# Patient Record
Sex: Female | Born: 1960 | Race: White | Hispanic: No | Marital: Married | Smoking: Never smoker
Health system: Southern US, Community
[De-identification: ages and names within clinical notes are randomized; demographics above are authoritative.]

## PROBLEM LIST (undated history)

## (undated) DIAGNOSIS — E079 Disorder of thyroid, unspecified: Secondary | ICD-10-CM

## (undated) DIAGNOSIS — E785 Hyperlipidemia, unspecified: Secondary | ICD-10-CM

## (undated) DIAGNOSIS — F32A Depression, unspecified: Secondary | ICD-10-CM

## (undated) DIAGNOSIS — I10 Essential (primary) hypertension: Secondary | ICD-10-CM

## (undated) DIAGNOSIS — M1990 Unspecified osteoarthritis, unspecified site: Secondary | ICD-10-CM

## (undated) HISTORY — DX: Unspecified osteoarthritis, unspecified site: M19.90

## (undated) HISTORY — DX: Hyperlipidemia, unspecified: E78.5

## (undated) HISTORY — DX: Essential (primary) hypertension: I10

## (undated) HISTORY — PX: BREAST CYST ASPIRATION: SHX578

## (undated) HISTORY — PX: BACK SURGERY: SHX140

## (undated) HISTORY — PX: ROTATOR CUFF REPAIR: SHX139

## (undated) HISTORY — DX: Disorder of thyroid, unspecified: E07.9

## (undated) HISTORY — DX: Depression, unspecified: F32.A

## (undated) HISTORY — PX: CHOLECYSTECTOMY: SHX55

---

## 1999-02-22 ENCOUNTER — Emergency Department (HOSPITAL_COMMUNITY): Admission: EM | Admit: 1999-02-22 | Discharge: 1999-02-22 | Payer: Self-pay | Admitting: Emergency Medicine

## 1999-03-24 ENCOUNTER — Encounter: Payer: Self-pay | Admitting: Neurosurgery

## 1999-03-24 ENCOUNTER — Ambulatory Visit (HOSPITAL_COMMUNITY): Admission: RE | Admit: 1999-03-24 | Discharge: 1999-03-24 | Payer: Self-pay | Admitting: Neurosurgery

## 1999-03-29 ENCOUNTER — Encounter: Payer: Self-pay | Admitting: Neurosurgery

## 1999-04-02 ENCOUNTER — Encounter: Payer: Self-pay | Admitting: Neurosurgery

## 1999-04-02 ENCOUNTER — Inpatient Hospital Stay (HOSPITAL_COMMUNITY): Admission: RE | Admit: 1999-04-02 | Discharge: 1999-04-03 | Payer: Self-pay | Admitting: Neurosurgery

## 1999-11-07 ENCOUNTER — Other Ambulatory Visit: Admission: RE | Admit: 1999-11-07 | Discharge: 1999-11-07 | Payer: Self-pay | Admitting: Obstetrics and Gynecology

## 2001-08-20 ENCOUNTER — Other Ambulatory Visit: Admission: RE | Admit: 2001-08-20 | Discharge: 2001-08-20 | Payer: Self-pay | Admitting: Obstetrics and Gynecology

## 2002-09-07 ENCOUNTER — Other Ambulatory Visit: Admission: RE | Admit: 2002-09-07 | Discharge: 2002-09-07 | Payer: Self-pay | Admitting: Obstetrics and Gynecology

## 2003-10-04 ENCOUNTER — Other Ambulatory Visit: Admission: RE | Admit: 2003-10-04 | Discharge: 2003-10-04 | Payer: Self-pay | Admitting: Obstetrics and Gynecology

## 2004-10-23 ENCOUNTER — Other Ambulatory Visit: Admission: RE | Admit: 2004-10-23 | Discharge: 2004-10-23 | Payer: Self-pay | Admitting: Obstetrics and Gynecology

## 2006-01-09 ENCOUNTER — Other Ambulatory Visit: Admission: RE | Admit: 2006-01-09 | Discharge: 2006-01-09 | Payer: Self-pay | Admitting: Obstetrics and Gynecology

## 2006-04-06 ENCOUNTER — Emergency Department (HOSPITAL_COMMUNITY): Admission: EM | Admit: 2006-04-06 | Discharge: 2006-04-06 | Payer: Self-pay | Admitting: Emergency Medicine

## 2006-04-10 ENCOUNTER — Ambulatory Visit (HOSPITAL_COMMUNITY): Admission: RE | Admit: 2006-04-10 | Discharge: 2006-04-10 | Payer: Self-pay | Admitting: Neurosurgery

## 2006-12-31 ENCOUNTER — Encounter: Admission: RE | Admit: 2006-12-31 | Discharge: 2006-12-31 | Payer: Self-pay | Admitting: Neurosurgery

## 2007-02-12 ENCOUNTER — Inpatient Hospital Stay (HOSPITAL_COMMUNITY): Admission: RE | Admit: 2007-02-12 | Discharge: 2007-02-15 | Payer: Self-pay | Admitting: Neurosurgery

## 2011-07-29 DIAGNOSIS — N841 Polyp of cervix uteri: Secondary | ICD-10-CM | POA: Insufficient documentation

## 2015-10-30 DIAGNOSIS — N63 Unspecified lump in unspecified breast: Secondary | ICD-10-CM | POA: Insufficient documentation

## 2015-10-31 ENCOUNTER — Other Ambulatory Visit: Payer: Self-pay | Admitting: Obstetrics and Gynecology

## 2015-10-31 DIAGNOSIS — R928 Other abnormal and inconclusive findings on diagnostic imaging of breast: Secondary | ICD-10-CM

## 2015-11-07 ENCOUNTER — Other Ambulatory Visit: Payer: Self-pay | Admitting: Obstetrics and Gynecology

## 2015-11-07 ENCOUNTER — Ambulatory Visit
Admission: RE | Admit: 2015-11-07 | Discharge: 2015-11-07 | Disposition: A | Discharge status: Home / Self Care | Payer: BLUE CROSS/BLUE SHIELD | Source: Ambulatory Visit | Attending: Obstetrics and Gynecology | Admitting: Obstetrics and Gynecology

## 2015-11-07 DIAGNOSIS — R928 Other abnormal and inconclusive findings on diagnostic imaging of breast: Secondary | ICD-10-CM

## 2015-11-07 DIAGNOSIS — N6001 Solitary cyst of right breast: Secondary | ICD-10-CM

## 2019-01-10 DIAGNOSIS — Z1231 Encounter for screening mammogram for malignant neoplasm of breast: Secondary | ICD-10-CM | POA: Diagnosis not present

## 2019-01-14 DIAGNOSIS — Z01818 Encounter for other preprocedural examination: Secondary | ICD-10-CM | POA: Diagnosis not present

## 2019-01-14 DIAGNOSIS — E039 Hypothyroidism, unspecified: Secondary | ICD-10-CM | POA: Diagnosis not present

## 2019-01-14 DIAGNOSIS — M25512 Pain in left shoulder: Secondary | ICD-10-CM | POA: Diagnosis not present

## 2019-01-27 DIAGNOSIS — M94212 Chondromalacia, left shoulder: Secondary | ICD-10-CM | POA: Diagnosis not present

## 2019-01-27 DIAGNOSIS — G8918 Other acute postprocedural pain: Secondary | ICD-10-CM | POA: Diagnosis not present

## 2019-01-27 DIAGNOSIS — M65812 Other synovitis and tenosynovitis, left shoulder: Secondary | ICD-10-CM | POA: Diagnosis not present

## 2019-01-27 DIAGNOSIS — S46212A Strain of muscle, fascia and tendon of other parts of biceps, left arm, initial encounter: Secondary | ICD-10-CM | POA: Diagnosis not present

## 2019-01-27 DIAGNOSIS — M75112 Incomplete rotator cuff tear or rupture of left shoulder, not specified as traumatic: Secondary | ICD-10-CM | POA: Diagnosis not present

## 2019-01-27 DIAGNOSIS — I1310 Hypertensive heart and chronic kidney disease without heart failure, with stage 1 through stage 4 chronic kidney disease, or unspecified chronic kidney disease: Secondary | ICD-10-CM | POA: Diagnosis not present

## 2019-01-27 DIAGNOSIS — M19012 Primary osteoarthritis, left shoulder: Secondary | ICD-10-CM | POA: Diagnosis not present

## 2019-01-27 DIAGNOSIS — M75122 Complete rotator cuff tear or rupture of left shoulder, not specified as traumatic: Secondary | ICD-10-CM | POA: Diagnosis not present

## 2019-01-27 DIAGNOSIS — M7542 Impingement syndrome of left shoulder: Secondary | ICD-10-CM | POA: Diagnosis not present

## 2019-01-27 DIAGNOSIS — E039 Hypothyroidism, unspecified: Secondary | ICD-10-CM | POA: Diagnosis not present

## 2019-01-27 DIAGNOSIS — M778 Other enthesopathies, not elsewhere classified: Secondary | ICD-10-CM | POA: Diagnosis not present

## 2019-01-27 DIAGNOSIS — E1122 Type 2 diabetes mellitus with diabetic chronic kidney disease: Secondary | ICD-10-CM | POA: Diagnosis not present

## 2019-01-27 DIAGNOSIS — E1169 Type 2 diabetes mellitus with other specified complication: Secondary | ICD-10-CM | POA: Diagnosis not present

## 2019-01-27 DIAGNOSIS — N182 Chronic kidney disease, stage 2 (mild): Secondary | ICD-10-CM | POA: Diagnosis not present

## 2019-01-27 DIAGNOSIS — M7552 Bursitis of left shoulder: Secondary | ICD-10-CM | POA: Diagnosis not present

## 2019-01-27 DIAGNOSIS — E785 Hyperlipidemia, unspecified: Secondary | ICD-10-CM | POA: Diagnosis not present

## 2019-01-27 DIAGNOSIS — E559 Vitamin D deficiency, unspecified: Secondary | ICD-10-CM | POA: Diagnosis not present

## 2019-01-31 DIAGNOSIS — M6281 Muscle weakness (generalized): Secondary | ICD-10-CM | POA: Diagnosis not present

## 2019-01-31 DIAGNOSIS — M75122 Complete rotator cuff tear or rupture of left shoulder, not specified as traumatic: Secondary | ICD-10-CM | POA: Diagnosis not present

## 2019-01-31 DIAGNOSIS — Z4789 Encounter for other orthopedic aftercare: Secondary | ICD-10-CM | POA: Diagnosis not present

## 2019-02-04 DIAGNOSIS — M6281 Muscle weakness (generalized): Secondary | ICD-10-CM | POA: Diagnosis not present

## 2019-02-04 DIAGNOSIS — M75122 Complete rotator cuff tear or rupture of left shoulder, not specified as traumatic: Secondary | ICD-10-CM | POA: Diagnosis not present

## 2019-02-04 DIAGNOSIS — Z4789 Encounter for other orthopedic aftercare: Secondary | ICD-10-CM | POA: Diagnosis not present

## 2019-02-07 DIAGNOSIS — M6281 Muscle weakness (generalized): Secondary | ICD-10-CM | POA: Diagnosis not present

## 2019-02-07 DIAGNOSIS — M75122 Complete rotator cuff tear or rupture of left shoulder, not specified as traumatic: Secondary | ICD-10-CM | POA: Diagnosis not present

## 2019-02-07 DIAGNOSIS — Z4789 Encounter for other orthopedic aftercare: Secondary | ICD-10-CM | POA: Diagnosis not present

## 2019-02-10 DIAGNOSIS — M75122 Complete rotator cuff tear or rupture of left shoulder, not specified as traumatic: Secondary | ICD-10-CM | POA: Diagnosis not present

## 2019-02-10 DIAGNOSIS — Z4789 Encounter for other orthopedic aftercare: Secondary | ICD-10-CM | POA: Diagnosis not present

## 2019-02-10 DIAGNOSIS — M6281 Muscle weakness (generalized): Secondary | ICD-10-CM | POA: Diagnosis not present

## 2019-02-17 DIAGNOSIS — Z4789 Encounter for other orthopedic aftercare: Secondary | ICD-10-CM | POA: Diagnosis not present

## 2019-02-17 DIAGNOSIS — M75122 Complete rotator cuff tear or rupture of left shoulder, not specified as traumatic: Secondary | ICD-10-CM | POA: Diagnosis not present

## 2019-02-17 DIAGNOSIS — M6281 Muscle weakness (generalized): Secondary | ICD-10-CM | POA: Diagnosis not present

## 2019-02-24 DIAGNOSIS — M6281 Muscle weakness (generalized): Secondary | ICD-10-CM | POA: Diagnosis not present

## 2019-02-24 DIAGNOSIS — M75122 Complete rotator cuff tear or rupture of left shoulder, not specified as traumatic: Secondary | ICD-10-CM | POA: Diagnosis not present

## 2019-02-24 DIAGNOSIS — Z4789 Encounter for other orthopedic aftercare: Secondary | ICD-10-CM | POA: Diagnosis not present

## 2019-03-03 DIAGNOSIS — M6281 Muscle weakness (generalized): Secondary | ICD-10-CM | POA: Diagnosis not present

## 2019-03-03 DIAGNOSIS — Z4789 Encounter for other orthopedic aftercare: Secondary | ICD-10-CM | POA: Diagnosis not present

## 2019-03-03 DIAGNOSIS — M75122 Complete rotator cuff tear or rupture of left shoulder, not specified as traumatic: Secondary | ICD-10-CM | POA: Diagnosis not present

## 2019-03-08 DIAGNOSIS — M6281 Muscle weakness (generalized): Secondary | ICD-10-CM | POA: Diagnosis not present

## 2019-03-08 DIAGNOSIS — Z4789 Encounter for other orthopedic aftercare: Secondary | ICD-10-CM | POA: Diagnosis not present

## 2019-03-08 DIAGNOSIS — M75122 Complete rotator cuff tear or rupture of left shoulder, not specified as traumatic: Secondary | ICD-10-CM | POA: Diagnosis not present

## 2019-03-15 DIAGNOSIS — Z4789 Encounter for other orthopedic aftercare: Secondary | ICD-10-CM | POA: Diagnosis not present

## 2019-03-15 DIAGNOSIS — M75122 Complete rotator cuff tear or rupture of left shoulder, not specified as traumatic: Secondary | ICD-10-CM | POA: Diagnosis not present

## 2019-03-15 DIAGNOSIS — M6281 Muscle weakness (generalized): Secondary | ICD-10-CM | POA: Diagnosis not present

## 2019-03-17 DIAGNOSIS — Z4789 Encounter for other orthopedic aftercare: Secondary | ICD-10-CM | POA: Diagnosis not present

## 2019-03-17 DIAGNOSIS — M6281 Muscle weakness (generalized): Secondary | ICD-10-CM | POA: Diagnosis not present

## 2019-03-17 DIAGNOSIS — M75122 Complete rotator cuff tear or rupture of left shoulder, not specified as traumatic: Secondary | ICD-10-CM | POA: Diagnosis not present

## 2019-03-21 DIAGNOSIS — M6281 Muscle weakness (generalized): Secondary | ICD-10-CM | POA: Diagnosis not present

## 2019-03-21 DIAGNOSIS — M75122 Complete rotator cuff tear or rupture of left shoulder, not specified as traumatic: Secondary | ICD-10-CM | POA: Diagnosis not present

## 2019-03-21 DIAGNOSIS — Z4789 Encounter for other orthopedic aftercare: Secondary | ICD-10-CM | POA: Diagnosis not present

## 2019-03-30 DIAGNOSIS — Z4789 Encounter for other orthopedic aftercare: Secondary | ICD-10-CM | POA: Diagnosis not present

## 2019-03-30 DIAGNOSIS — M6281 Muscle weakness (generalized): Secondary | ICD-10-CM | POA: Diagnosis not present

## 2019-03-30 DIAGNOSIS — M75122 Complete rotator cuff tear or rupture of left shoulder, not specified as traumatic: Secondary | ICD-10-CM | POA: Diagnosis not present

## 2019-04-07 DIAGNOSIS — R031 Nonspecific low blood-pressure reading: Secondary | ICD-10-CM | POA: Diagnosis not present

## 2019-04-13 DIAGNOSIS — M6281 Muscle weakness (generalized): Secondary | ICD-10-CM | POA: Diagnosis not present

## 2019-04-13 DIAGNOSIS — M75122 Complete rotator cuff tear or rupture of left shoulder, not specified as traumatic: Secondary | ICD-10-CM | POA: Diagnosis not present

## 2019-04-13 DIAGNOSIS — Z4789 Encounter for other orthopedic aftercare: Secondary | ICD-10-CM | POA: Diagnosis not present

## 2019-04-18 DIAGNOSIS — E039 Hypothyroidism, unspecified: Secondary | ICD-10-CM | POA: Diagnosis not present

## 2019-04-18 DIAGNOSIS — I10 Essential (primary) hypertension: Secondary | ICD-10-CM | POA: Diagnosis not present

## 2019-04-18 DIAGNOSIS — R739 Hyperglycemia, unspecified: Secondary | ICD-10-CM | POA: Diagnosis not present

## 2019-04-18 DIAGNOSIS — Z1331 Encounter for screening for depression: Secondary | ICD-10-CM | POA: Diagnosis not present

## 2019-04-18 DIAGNOSIS — F331 Major depressive disorder, recurrent, moderate: Secondary | ICD-10-CM | POA: Diagnosis not present

## 2019-04-22 DIAGNOSIS — Z9889 Other specified postprocedural states: Secondary | ICD-10-CM | POA: Diagnosis not present

## 2019-04-22 DIAGNOSIS — E039 Hypothyroidism, unspecified: Secondary | ICD-10-CM | POA: Diagnosis not present

## 2019-04-22 DIAGNOSIS — R739 Hyperglycemia, unspecified: Secondary | ICD-10-CM | POA: Diagnosis not present

## 2019-04-22 DIAGNOSIS — M75122 Complete rotator cuff tear or rupture of left shoulder, not specified as traumatic: Secondary | ICD-10-CM | POA: Diagnosis not present

## 2019-04-22 DIAGNOSIS — M4802 Spinal stenosis, cervical region: Secondary | ICD-10-CM | POA: Diagnosis not present

## 2019-04-22 DIAGNOSIS — M19012 Primary osteoarthritis, left shoulder: Secondary | ICD-10-CM | POA: Diagnosis not present

## 2019-05-06 DIAGNOSIS — R1011 Right upper quadrant pain: Secondary | ICD-10-CM | POA: Diagnosis not present

## 2019-05-06 DIAGNOSIS — E039 Hypothyroidism, unspecified: Secondary | ICD-10-CM | POA: Diagnosis not present

## 2019-05-06 DIAGNOSIS — K219 Gastro-esophageal reflux disease without esophagitis: Secondary | ICD-10-CM | POA: Diagnosis not present

## 2019-05-10 DIAGNOSIS — K8020 Calculus of gallbladder without cholecystitis without obstruction: Secondary | ICD-10-CM | POA: Diagnosis not present

## 2019-05-10 DIAGNOSIS — K760 Fatty (change of) liver, not elsewhere classified: Secondary | ICD-10-CM | POA: Diagnosis not present

## 2019-05-10 DIAGNOSIS — R1011 Right upper quadrant pain: Secondary | ICD-10-CM | POA: Diagnosis not present

## 2019-05-10 DIAGNOSIS — K8080 Other cholelithiasis without obstruction: Secondary | ICD-10-CM | POA: Diagnosis not present

## 2019-05-17 DIAGNOSIS — R1013 Epigastric pain: Secondary | ICD-10-CM | POA: Insufficient documentation

## 2019-05-17 DIAGNOSIS — K8010 Calculus of gallbladder with chronic cholecystitis without obstruction: Secondary | ICD-10-CM | POA: Insufficient documentation

## 2019-05-17 DIAGNOSIS — Z1211 Encounter for screening for malignant neoplasm of colon: Secondary | ICD-10-CM | POA: Insufficient documentation

## 2019-05-27 DIAGNOSIS — K8044 Calculus of bile duct with chronic cholecystitis without obstruction: Secondary | ICD-10-CM | POA: Diagnosis not present

## 2019-05-27 DIAGNOSIS — M1990 Unspecified osteoarthritis, unspecified site: Secondary | ICD-10-CM | POA: Diagnosis not present

## 2019-05-27 DIAGNOSIS — K8010 Calculus of gallbladder with chronic cholecystitis without obstruction: Secondary | ICD-10-CM | POA: Diagnosis not present

## 2019-05-27 DIAGNOSIS — K219 Gastro-esophageal reflux disease without esophagitis: Secondary | ICD-10-CM | POA: Diagnosis not present

## 2019-05-27 DIAGNOSIS — Z79899 Other long term (current) drug therapy: Secondary | ICD-10-CM | POA: Diagnosis not present

## 2019-05-27 DIAGNOSIS — K8020 Calculus of gallbladder without cholecystitis without obstruction: Secondary | ICD-10-CM | POA: Diagnosis not present

## 2019-05-27 DIAGNOSIS — F419 Anxiety disorder, unspecified: Secondary | ICD-10-CM | POA: Diagnosis not present

## 2019-05-27 DIAGNOSIS — E7800 Pure hypercholesterolemia, unspecified: Secondary | ICD-10-CM | POA: Diagnosis not present

## 2019-05-27 DIAGNOSIS — I10 Essential (primary) hypertension: Secondary | ICD-10-CM | POA: Diagnosis not present

## 2019-05-27 DIAGNOSIS — E119 Type 2 diabetes mellitus without complications: Secondary | ICD-10-CM | POA: Diagnosis not present

## 2019-06-14 DIAGNOSIS — N909 Noninflammatory disorder of vulva and perineum, unspecified: Secondary | ICD-10-CM | POA: Insufficient documentation

## 2019-06-14 DIAGNOSIS — F329 Major depressive disorder, single episode, unspecified: Secondary | ICD-10-CM | POA: Insufficient documentation

## 2019-06-14 DIAGNOSIS — F32A Depression, unspecified: Secondary | ICD-10-CM | POA: Insufficient documentation

## 2019-06-14 DIAGNOSIS — Z09 Encounter for follow-up examination after completed treatment for conditions other than malignant neoplasm: Secondary | ICD-10-CM | POA: Insufficient documentation

## 2019-06-14 DIAGNOSIS — I10 Essential (primary) hypertension: Secondary | ICD-10-CM | POA: Insufficient documentation

## 2019-06-14 DIAGNOSIS — M545 Low back pain, unspecified: Secondary | ICD-10-CM | POA: Insufficient documentation

## 2019-06-27 DIAGNOSIS — L309 Dermatitis, unspecified: Secondary | ICD-10-CM | POA: Diagnosis not present

## 2019-06-27 DIAGNOSIS — L820 Inflamed seborrheic keratosis: Secondary | ICD-10-CM | POA: Diagnosis not present

## 2019-07-26 DIAGNOSIS — I10 Essential (primary) hypertension: Secondary | ICD-10-CM | POA: Diagnosis not present

## 2019-08-17 DIAGNOSIS — M791 Myalgia, unspecified site: Secondary | ICD-10-CM | POA: Diagnosis not present

## 2019-08-17 DIAGNOSIS — M2550 Pain in unspecified joint: Secondary | ICD-10-CM | POA: Diagnosis not present

## 2019-08-17 DIAGNOSIS — E039 Hypothyroidism, unspecified: Secondary | ICD-10-CM | POA: Diagnosis not present

## 2019-09-09 DIAGNOSIS — D72829 Elevated white blood cell count, unspecified: Secondary | ICD-10-CM | POA: Diagnosis not present

## 2019-09-09 DIAGNOSIS — I10 Essential (primary) hypertension: Secondary | ICD-10-CM | POA: Diagnosis not present

## 2019-09-09 DIAGNOSIS — R7303 Prediabetes: Secondary | ICD-10-CM | POA: Diagnosis not present

## 2019-09-09 DIAGNOSIS — E039 Hypothyroidism, unspecified: Secondary | ICD-10-CM | POA: Diagnosis not present

## 2019-09-09 DIAGNOSIS — E559 Vitamin D deficiency, unspecified: Secondary | ICD-10-CM | POA: Diagnosis not present

## 2019-09-09 DIAGNOSIS — E785 Hyperlipidemia, unspecified: Secondary | ICD-10-CM | POA: Diagnosis not present

## 2019-09-14 DIAGNOSIS — M25512 Pain in left shoulder: Secondary | ICD-10-CM | POA: Diagnosis not present

## 2019-09-14 DIAGNOSIS — M25562 Pain in left knee: Secondary | ICD-10-CM | POA: Diagnosis not present

## 2019-09-14 DIAGNOSIS — M1990 Unspecified osteoarthritis, unspecified site: Secondary | ICD-10-CM | POA: Diagnosis not present

## 2019-09-14 DIAGNOSIS — M25561 Pain in right knee: Secondary | ICD-10-CM | POA: Diagnosis not present

## 2019-09-14 DIAGNOSIS — M79643 Pain in unspecified hand: Secondary | ICD-10-CM | POA: Diagnosis not present

## 2019-09-14 DIAGNOSIS — M19011 Primary osteoarthritis, right shoulder: Secondary | ICD-10-CM | POA: Diagnosis not present

## 2019-09-14 DIAGNOSIS — M1712 Unilateral primary osteoarthritis, left knee: Secondary | ICD-10-CM | POA: Diagnosis not present

## 2019-09-14 DIAGNOSIS — M79642 Pain in left hand: Secondary | ICD-10-CM | POA: Diagnosis not present

## 2019-09-14 DIAGNOSIS — M25511 Pain in right shoulder: Secondary | ICD-10-CM | POA: Diagnosis not present

## 2019-09-14 DIAGNOSIS — M19012 Primary osteoarthritis, left shoulder: Secondary | ICD-10-CM | POA: Diagnosis not present

## 2019-09-14 DIAGNOSIS — M79641 Pain in right hand: Secondary | ICD-10-CM | POA: Diagnosis not present

## 2019-09-14 DIAGNOSIS — M064 Inflammatory polyarthropathy: Secondary | ICD-10-CM | POA: Diagnosis not present

## 2019-09-14 DIAGNOSIS — M25569 Pain in unspecified knee: Secondary | ICD-10-CM | POA: Diagnosis not present

## 2019-10-03 DIAGNOSIS — M064 Inflammatory polyarthropathy: Secondary | ICD-10-CM | POA: Diagnosis not present

## 2019-10-03 DIAGNOSIS — D8989 Other specified disorders involving the immune mechanism, not elsewhere classified: Secondary | ICD-10-CM | POA: Diagnosis not present

## 2019-10-04 DIAGNOSIS — Z1211 Encounter for screening for malignant neoplasm of colon: Secondary | ICD-10-CM | POA: Diagnosis not present

## 2019-10-17 DIAGNOSIS — M064 Inflammatory polyarthropathy: Secondary | ICD-10-CM | POA: Diagnosis not present

## 2019-10-17 DIAGNOSIS — R768 Other specified abnormal immunological findings in serum: Secondary | ICD-10-CM | POA: Diagnosis not present

## 2019-10-17 DIAGNOSIS — M1990 Unspecified osteoarthritis, unspecified site: Secondary | ICD-10-CM | POA: Diagnosis not present

## 2019-10-17 DIAGNOSIS — M25569 Pain in unspecified knee: Secondary | ICD-10-CM | POA: Diagnosis not present

## 2019-10-27 DIAGNOSIS — Z1211 Encounter for screening for malignant neoplasm of colon: Secondary | ICD-10-CM | POA: Diagnosis not present

## 2019-10-27 DIAGNOSIS — I10 Essential (primary) hypertension: Secondary | ICD-10-CM | POA: Diagnosis not present

## 2019-10-27 LAB — HM COLONOSCOPY

## 2019-11-30 DIAGNOSIS — M25511 Pain in right shoulder: Secondary | ICD-10-CM | POA: Diagnosis not present

## 2019-11-30 DIAGNOSIS — M7541 Impingement syndrome of right shoulder: Secondary | ICD-10-CM | POA: Diagnosis not present

## 2019-12-14 DIAGNOSIS — J205 Acute bronchitis due to respiratory syncytial virus: Secondary | ICD-10-CM | POA: Diagnosis not present

## 2019-12-14 DIAGNOSIS — R05 Cough: Secondary | ICD-10-CM | POA: Diagnosis not present

## 2019-12-14 DIAGNOSIS — S6391XA Sprain of unspecified part of right wrist and hand, initial encounter: Secondary | ICD-10-CM | POA: Diagnosis not present

## 2019-12-14 DIAGNOSIS — Z20828 Contact with and (suspected) exposure to other viral communicable diseases: Secondary | ICD-10-CM | POA: Diagnosis not present

## 2020-02-25 DIAGNOSIS — Z1231 Encounter for screening mammogram for malignant neoplasm of breast: Secondary | ICD-10-CM | POA: Diagnosis not present

## 2020-04-12 DIAGNOSIS — R7303 Prediabetes: Secondary | ICD-10-CM | POA: Diagnosis not present

## 2020-04-12 DIAGNOSIS — E039 Hypothyroidism, unspecified: Secondary | ICD-10-CM | POA: Diagnosis not present

## 2020-04-12 DIAGNOSIS — E785 Hyperlipidemia, unspecified: Secondary | ICD-10-CM | POA: Diagnosis not present

## 2020-04-12 DIAGNOSIS — I10 Essential (primary) hypertension: Secondary | ICD-10-CM | POA: Diagnosis not present

## 2020-04-12 DIAGNOSIS — E559 Vitamin D deficiency, unspecified: Secondary | ICD-10-CM | POA: Diagnosis not present

## 2020-05-10 DIAGNOSIS — G5602 Carpal tunnel syndrome, left upper limb: Secondary | ICD-10-CM | POA: Diagnosis not present

## 2020-06-22 DIAGNOSIS — G5623 Lesion of ulnar nerve, bilateral upper limbs: Secondary | ICD-10-CM | POA: Insufficient documentation

## 2020-06-22 DIAGNOSIS — R202 Paresthesia of skin: Secondary | ICD-10-CM | POA: Diagnosis not present

## 2020-06-22 DIAGNOSIS — G5603 Carpal tunnel syndrome, bilateral upper limbs: Secondary | ICD-10-CM | POA: Diagnosis not present

## 2020-07-26 DIAGNOSIS — R200 Anesthesia of skin: Secondary | ICD-10-CM | POA: Diagnosis not present

## 2020-07-26 DIAGNOSIS — G5603 Carpal tunnel syndrome, bilateral upper limbs: Secondary | ICD-10-CM | POA: Diagnosis not present

## 2020-08-15 DIAGNOSIS — L820 Inflamed seborrheic keratosis: Secondary | ICD-10-CM | POA: Diagnosis not present

## 2020-08-15 DIAGNOSIS — D485 Neoplasm of uncertain behavior of skin: Secondary | ICD-10-CM | POA: Diagnosis not present

## 2020-08-15 DIAGNOSIS — L821 Other seborrheic keratosis: Secondary | ICD-10-CM | POA: Diagnosis not present

## 2020-08-15 DIAGNOSIS — D2239 Melanocytic nevi of other parts of face: Secondary | ICD-10-CM | POA: Diagnosis not present

## 2020-08-16 DIAGNOSIS — R040 Epistaxis: Secondary | ICD-10-CM | POA: Diagnosis not present

## 2020-08-16 DIAGNOSIS — Z20828 Contact with and (suspected) exposure to other viral communicable diseases: Secondary | ICD-10-CM | POA: Diagnosis not present

## 2020-08-16 DIAGNOSIS — R05 Cough: Secondary | ICD-10-CM | POA: Diagnosis not present

## 2020-09-08 DIAGNOSIS — W19XXXA Unspecified fall, initial encounter: Secondary | ICD-10-CM | POA: Diagnosis not present

## 2020-09-08 DIAGNOSIS — S8992XA Unspecified injury of left lower leg, initial encounter: Secondary | ICD-10-CM | POA: Diagnosis not present

## 2020-09-08 DIAGNOSIS — S8392XA Sprain of unspecified site of left knee, initial encounter: Secondary | ICD-10-CM | POA: Diagnosis not present

## 2020-09-08 DIAGNOSIS — S72432A Displaced fracture of medial condyle of left femur, initial encounter for closed fracture: Secondary | ICD-10-CM | POA: Diagnosis not present

## 2020-09-08 DIAGNOSIS — Y929 Unspecified place or not applicable: Secondary | ICD-10-CM | POA: Diagnosis not present

## 2020-09-11 DIAGNOSIS — C44329 Squamous cell carcinoma of skin of other parts of face: Secondary | ICD-10-CM | POA: Diagnosis not present

## 2020-09-17 DIAGNOSIS — S83412A Sprain of medial collateral ligament of left knee, initial encounter: Secondary | ICD-10-CM | POA: Diagnosis not present

## 2020-09-17 DIAGNOSIS — S72492A Other fracture of lower end of left femur, initial encounter for closed fracture: Secondary | ICD-10-CM | POA: Diagnosis not present

## 2020-10-01 DIAGNOSIS — S83412A Sprain of medial collateral ligament of left knee, initial encounter: Secondary | ICD-10-CM | POA: Diagnosis not present

## 2020-10-01 DIAGNOSIS — S72492A Other fracture of lower end of left femur, initial encounter for closed fracture: Secondary | ICD-10-CM | POA: Diagnosis not present

## 2020-10-11 DIAGNOSIS — C44329 Squamous cell carcinoma of skin of other parts of face: Secondary | ICD-10-CM | POA: Diagnosis not present

## 2020-10-11 DIAGNOSIS — L820 Inflamed seborrheic keratosis: Secondary | ICD-10-CM | POA: Diagnosis not present

## 2020-10-11 DIAGNOSIS — D485 Neoplasm of uncertain behavior of skin: Secondary | ICD-10-CM | POA: Diagnosis not present

## 2020-10-16 DIAGNOSIS — E785 Hyperlipidemia, unspecified: Secondary | ICD-10-CM | POA: Diagnosis not present

## 2020-10-16 DIAGNOSIS — I10 Essential (primary) hypertension: Secondary | ICD-10-CM | POA: Diagnosis not present

## 2020-10-16 DIAGNOSIS — E039 Hypothyroidism, unspecified: Secondary | ICD-10-CM | POA: Diagnosis not present

## 2020-10-16 DIAGNOSIS — R7303 Prediabetes: Secondary | ICD-10-CM | POA: Diagnosis not present

## 2020-10-16 DIAGNOSIS — E559 Vitamin D deficiency, unspecified: Secondary | ICD-10-CM | POA: Diagnosis not present

## 2020-10-29 DIAGNOSIS — S72492A Other fracture of lower end of left femur, initial encounter for closed fracture: Secondary | ICD-10-CM | POA: Diagnosis not present

## 2020-10-29 DIAGNOSIS — S83412A Sprain of medial collateral ligament of left knee, initial encounter: Secondary | ICD-10-CM | POA: Diagnosis not present

## 2020-10-31 DIAGNOSIS — G5603 Carpal tunnel syndrome, bilateral upper limbs: Secondary | ICD-10-CM | POA: Diagnosis not present

## 2021-01-18 DIAGNOSIS — J029 Acute pharyngitis, unspecified: Secondary | ICD-10-CM | POA: Diagnosis not present

## 2021-01-18 DIAGNOSIS — R519 Headache, unspecified: Secondary | ICD-10-CM | POA: Diagnosis not present

## 2021-01-18 DIAGNOSIS — Z20828 Contact with and (suspected) exposure to other viral communicable diseases: Secondary | ICD-10-CM | POA: Diagnosis not present

## 2021-02-26 ENCOUNTER — Ambulatory Visit (INDEPENDENT_AMBULATORY_CARE_PROVIDER_SITE_OTHER): Payer: BC Managed Care – PPO | Admitting: Physician Assistant

## 2021-02-26 ENCOUNTER — Other Ambulatory Visit: Payer: Self-pay

## 2021-02-26 ENCOUNTER — Encounter: Payer: Self-pay | Admitting: Physician Assistant

## 2021-02-26 VITALS — BP 112/76 | HR 82 | Temp 97.6°F | Ht 64.0 in | Wt 173.0 lb

## 2021-02-26 DIAGNOSIS — E782 Mixed hyperlipidemia: Secondary | ICD-10-CM | POA: Diagnosis not present

## 2021-02-26 DIAGNOSIS — E039 Hypothyroidism, unspecified: Secondary | ICD-10-CM | POA: Diagnosis not present

## 2021-02-26 DIAGNOSIS — E559 Vitamin D deficiency, unspecified: Secondary | ICD-10-CM

## 2021-02-26 DIAGNOSIS — I10 Essential (primary) hypertension: Secondary | ICD-10-CM | POA: Insufficient documentation

## 2021-02-26 DIAGNOSIS — G5603 Carpal tunnel syndrome, bilateral upper limbs: Secondary | ICD-10-CM | POA: Diagnosis not present

## 2021-02-26 DIAGNOSIS — M542 Cervicalgia: Secondary | ICD-10-CM | POA: Insufficient documentation

## 2021-02-26 DIAGNOSIS — M1990 Unspecified osteoarthritis, unspecified site: Secondary | ICD-10-CM | POA: Insufficient documentation

## 2021-02-26 DIAGNOSIS — F418 Other specified anxiety disorders: Secondary | ICD-10-CM | POA: Insufficient documentation

## 2021-02-26 DIAGNOSIS — E785 Hyperlipidemia, unspecified: Secondary | ICD-10-CM | POA: Insufficient documentation

## 2021-02-26 MED ORDER — BUSPIRONE HCL 5 MG PO TABS: 5.0000 mg | ORAL_TABLET | Freq: Two times a day (BID) | ORAL | 1 refills | Status: DC

## 2021-02-26 MED ORDER — CYCLOBENZAPRINE HCL 5 MG PO TABS: 5.0000 mg | ORAL_TABLET | Freq: Every day | ORAL | 0 refills | Status: DC

## 2021-02-26 NOTE — Progress Notes (Signed)
New Patient Office Visit  Subjective:  Patient ID: Ebony Palmer, female    DOB: 25-May-1961  Age: 60 y.o. MRN: [id number]  CC:  Chief Complaint  Patient presents with  . Fatigue    HPI Ebony Palmer presents for fatigue for the past month - she thinks probably due to some breakthrough anxiety and depressive symptoms - she is currently on cymbalta 64m qd and it has worked well for her for years but now things bothering her more and feeling overwhelmed and stressed --- she cares for 2 grandsons and elderly father  Pt with history of hypothyroidism - currently on levothyroxine 769m qd - has not had labwork 'in awhile'  Pt presents for follow up of hypertension.The patient is tolerating the medication well without side effects. Compliance with treatment has been fair; I- she is currently on telmesartan 8017md  Mixed hyperlipidemia  Pt presents with hyperlipidemia. . Compliance with treatment has been fair - she is currently on crestor 5mg60m   Pt with history of vit D deficiency - is on weekly supplements - due for labwork  Pt with history of restless leg syndrome - stable on current med of requip 0.5mg 26m has history of carpal tunnel disorder - she follows with ortho - Dr YasteSamule Drystates that in the past week she has felt tightness in her neck - sore with laying down on her side and feels tense - cannot recall any specific injury or trauma     Past Medical History:  Diagnosis Date  . Arthritis   . Depression   . Hyperlipidemia   . Hypertension   . Thyroid disease     Past Surgical History:  Procedure Laterality Date  . BACK SURGERY    . CESAREAN SECTION    . CHOLECYSTECTOMY    . ROTATOR CUFF REPAIR Left     History reviewed. No pertinent family history.  Social History   Socioeconomic History  . Marital status: Married    Spouse name: Not on file  . Number of children: Not on file  . Years of education: Not on file  . Highest education level: Not on  file  Occupational History  . Not on file  Tobacco Use  . Smoking status: Never Smoker  . Smokeless tobacco: Never Used  Vaping Use  . Vaping Use: Never used  Substance and Sexual Activity  . Alcohol use: Never  . Drug use: Never  . Sexual activity: Yes  Other Topics Concern  . Not on file  Social History Narrative  . Not on file   Social Determinants of Health   Financial Resource Strain: Not on file  Food Insecurity: Not on file  Transportation Needs: Not on file  Physical Activity: Not on file  Stress: Not on file  Social Connections: Not on file  Intimate Partner Violence: Not on file     Current Outpatient Medications:  .  budesonide-formoterol (SYMBICORT) 160-4.5 MCG/ACT inhaler, Symbicort 160 mcg-4.5 mcg/actuation HFA aerosol inhaler  INHALE 2 PUFFS TWICE A DAY RINSE MOUTH AFTER USE, Disp: , Rfl:  .  busPIRone (BUSPAR) 5 MG tablet, Take 1 tablet (5 mg total) by mouth 2 (two) times daily., Disp: 60 tablet, Rfl: 1 .  cyclobenzaprine (FLEXERIL) 5 MG tablet, Take 1 tablet (5 mg total) by mouth at bedtime., Disp: 10 tablet, Rfl: 0 .  DULoxetine (CYMBALTA) 60 MG capsule, 1 capsule, Disp: , Rfl:  .  ergocalciferol (VITAMIN D2) 1.25 MG (50000  UT) capsule, 1 capsule, Disp: , Rfl:  .  levothyroxine (SYNTHROID) 75 MCG tablet, Take 75 mcg by mouth daily., Disp: , Rfl:  .  rOPINIRole (REQUIP) 0.5 MG tablet, Take 0.5 mg by mouth at bedtime., Disp: , Rfl:  .  rosuvastatin (CRESTOR) 5 MG tablet, Take 5 mg by mouth daily., Disp: , Rfl:  .  telmisartan (MICARDIS) 80 MG tablet, Take 80 mg by mouth daily., Disp: , Rfl:    Allergies  Allergen Reactions  . Codeine Nausea Only, Nausea And Vomiting and Rash    ROS CONSTITUTIONAL: see HPI E/N/T: Negative for ear pain, nasal congestion and sore throat.  CARDIOVASCULAR: Negative for chest pain, dizziness, palpitations and pedal edema.  RESPIRATORY: Negative for recent cough and dyspnea.  GASTROINTESTINAL: Negative for abdominal pain,  acid reflux symptoms, constipation, diarrhea, nausea and vomiting.  MSK: see HPI INTEGUMENTARY: Negative for rash.  NEUROLOGICAL: Negative for dizziness and headaches.  PSYCHIATRIC: see HPI       Objective:    PHYSICAL EXAM:   VS: BP 112/76 (BP Location: Left Arm, Patient Position: Sitting, Cuff Size: Normal)   Pulse 82   Temp 97.6 F (36.4 C) (Temporal)   Ht 5' 4" (1.626 m)   Wt 173 lb (78.5 kg)   SpO2 97%   BMI 29.70 kg/m   GEN: Well nourished, well developed, in no acute distress  Neck: no JVD or masses - no thyromegaly - tender to palpation anterior on right side Cardiac: RRR; no murmurs, rubs, or gallops,no edema -  Respiratory:  normal respiratory rate and pattern with no distress - normal breath sounds with no rales, rhonchi, wheezes or rubs MS: no deformity or atrophy - has brace on left wrist Skin: warm and dry, no rash  Neuro:  Alert and Oriented x 3, Strength and sensation are intact - CN II-Xii grossly intact Psych: tearful but consolable  BP 112/76 (BP Location: Left Arm, Patient Position: Sitting, Cuff Size: Normal)   Pulse 82   Temp 97.6 F (36.4 C) (Temporal)   Ht 5' 4" (1.626 m)   Wt 173 lb (78.5 kg)   SpO2 97%   BMI 29.70 kg/m  Wt Readings from Last 3 Encounters:  02/26/21 173 lb (78.5 kg)     Health Maintenance Due  Topic Date Due  . Hepatitis C Screening  Never done  . COVID-19 Vaccine (1) Never done  . HIV Screening  Never done  . TETANUS/TDAP  Never done  . PAP SMEAR-Modifier  Never done  . COLONOSCOPY (Pts 45-39yr Insurance coverage will need to be confirmed)  Never done  . MAMMOGRAM  Never done  . INFLUENZA VACCINE  07/29/2020    There are no preventive care reminders to display for this patient.  No results found for: TSH No results found for: WBC, HGB, HCT, MCV, PLT No results found for: NA, K, CHLORIDE, CO2, GLUCOSE, BUN, CREATININE, BILITOT, ALKPHOS, AST, ALT, PROT, ALBUMIN, CALCIUM, ANIONGAP, EGFR, GFR No results found for:  CHOL No results found for: HDL No results found for: LDLCALC No results found for: TRIG No results found for: CHOLHDL No results found for: HGBA1C    Assessment & Plan:   Problem List Items Addressed This Visit      Endocrine   Hypothyroidism - Primary   Relevant Medications   levothyroxine (SYNTHROID) 75 MCG tablet   Other Relevant Orders   TSH Continue current meds     Nervous and Auditory   Carpal tunnel syndrome, bilateral  Relevant Medications             Continue follow up with Dr Yaste     Other   Depressive disorder   Relevant Medications   DULoxetine (CYMBALTA) 60 MG capsule   busPIRone (BUSPAR) 5 MG tablet Follow up in 3 months or sooner if symptoms change or worsen   Hyperlipidemia   Relevant Medications   rosuvastatin (CRESTOR) 5 MG tablet   telmisartan (MICARDIS) 80 MG tablet Continue meds and watch diet   Other Relevant Orders   Lipid panel    Other Visit Diagnoses    Benign hypertension       Relevant Medications   rosuvastatin (CRESTOR) 5 MG tablet   telmisartan (MICARDIS) 80 MG tablet labwork pending   Other Relevant Orders   CBC with Differential/Platelet   Comprehensive metabolic panel   Vitamin D insufficiency       Relevant Orders   VITAMIN D 25 Hydroxy (Vit-D Deficiency, Fractures) Continue weekly supplements   Neck pain       Relevant Medications   cyclobenzaprine (FLEXERIL) 5 MG tablet rom exercises   Depression with anxiety       Relevant Medications   DULoxetine (CYMBALTA) 60 MG capsule   busPIRone (BUSPAR) 5 MG tablet      Meds ordered this encounter  Medications  . busPIRone (BUSPAR) 5 MG tablet    Sig: Take 1 tablet (5 mg total) by mouth 2 (two) times daily.    Dispense:  60 tablet    Refill:  1    Order Specific Question:   Supervising Provider    Answer:   COX, KIRSTEN [[id number]]  . cyclobenzaprine (FLEXERIL) 5 MG tablet    Sig: Take 1 tablet (5 mg total) by mouth at bedtime.    Dispense:  10 tablet    Refill:   0    Order Specific Question:   Supervising Provider    Answer:   COX, KIRSTEN [[id number]]    Follow-up: Return in about 3 months (around 05/29/2021) for chronic fasting follow up.    SARA R DAVIS, PA-C 

## 2021-02-27 LAB — CBC WITH DIFFERENTIAL/PLATELET
Basophils Absolute: 0.1 10*3/uL (ref 0.0–0.2)
Basos: 1 %
EOS (ABSOLUTE): 0.3 10*3/uL (ref 0.0–0.4)
Eos: 3 %
Hematocrit: 43.9 % (ref 34.0–46.6)
Hemoglobin: 14.4 g/dL (ref 11.1–15.9)
Immature Grans (Abs): 0 10*3/uL (ref 0.0–0.1)
Immature Granulocytes: 0 %
Lymphocytes Absolute: 2.7 10*3/uL (ref 0.7–3.1)
Lymphs: 28 %
MCH: 30.1 pg (ref 26.6–33.0)
MCHC: 32.8 g/dL (ref 31.5–35.7)
MCV: 92 fL (ref 79–97)
Monocytes Absolute: 1 10*3/uL — ABNORMAL HIGH (ref 0.1–0.9)
Monocytes: 11 %
Neutrophils Absolute: 5.6 10*3/uL (ref 1.4–7.0)
Neutrophils: 57 %
Platelets: 332 10*3/uL (ref 150–450)
RBC: 4.79 x10E6/uL (ref 3.77–5.28)
RDW: 12.5 % (ref 11.7–15.4)
WBC: 9.8 10*3/uL (ref 3.4–10.8)

## 2021-02-27 LAB — COMPREHENSIVE METABOLIC PANEL
ALT: 20 IU/L (ref 0–32)
AST: 22 IU/L (ref 0–40)
Albumin/Globulin Ratio: 1.8 (ref 1.2–2.2)
Albumin: 4.7 g/dL (ref 3.8–4.9)
Alkaline Phosphatase: 96 IU/L (ref 44–121)
BUN/Creatinine Ratio: 8 — ABNORMAL LOW (ref 9–23)
BUN: 10 mg/dL (ref 6–24)
Bilirubin Total: 0.3 mg/dL (ref 0.0–1.2)
CO2: 24 mmol/L (ref 20–29)
Calcium: 10.3 mg/dL — ABNORMAL HIGH (ref 8.7–10.2)
Chloride: 102 mmol/L (ref 96–106)
Creatinine, Ser: 1.22 mg/dL — ABNORMAL HIGH (ref 0.57–1.00)
Globulin, Total: 2.6 g/dL (ref 1.5–4.5)
Glucose: 96 mg/dL (ref 65–99)
Potassium: 5.3 mmol/L — ABNORMAL HIGH (ref 3.5–5.2)
Sodium: 140 mmol/L (ref 134–144)
Total Protein: 7.3 g/dL (ref 6.0–8.5)
eGFR: 52 mL/min/{1.73_m2} — ABNORMAL LOW (ref >59)

## 2021-02-27 LAB — LIPID PANEL
Chol/HDL Ratio: 3.8 ratio (ref 0.0–4.4)
Cholesterol, Total: 173 mg/dL (ref 100–199)
HDL: 46 mg/dL (ref >39)
LDL Chol Calc (NIH): 108 mg/dL — ABNORMAL HIGH (ref 0–99)
Triglycerides: 106 mg/dL (ref 0–149)
VLDL Cholesterol Cal: 19 mg/dL (ref 5–40)

## 2021-02-27 LAB — VITAMIN D 25 HYDROXY (VIT D DEFICIENCY, FRACTURES): Vit D, 25-Hydroxy: 59 ng/mL (ref 30.0–100.0)

## 2021-02-27 LAB — TSH: TSH: 4.54 u[IU]/mL — ABNORMAL HIGH (ref 0.450–4.500)

## 2021-02-27 LAB — CARDIOVASCULAR RISK ASSESSMENT

## 2021-03-20 ENCOUNTER — Ambulatory Visit (INDEPENDENT_AMBULATORY_CARE_PROVIDER_SITE_OTHER): Payer: BC Managed Care – PPO | Admitting: Physician Assistant

## 2021-03-20 ENCOUNTER — Other Ambulatory Visit: Payer: Self-pay

## 2021-03-20 ENCOUNTER — Other Ambulatory Visit: Payer: Self-pay | Admitting: Physician Assistant

## 2021-03-20 ENCOUNTER — Encounter: Payer: Self-pay | Admitting: Physician Assistant

## 2021-03-20 VITALS — BP 114/62 | HR 91 | Temp 97.0°F | Ht 64.0 in | Wt 174.6 lb

## 2021-03-20 DIAGNOSIS — M546 Pain in thoracic spine: Secondary | ICD-10-CM | POA: Diagnosis not present

## 2021-03-20 DIAGNOSIS — R059 Cough, unspecified: Secondary | ICD-10-CM | POA: Diagnosis not present

## 2021-03-20 DIAGNOSIS — R062 Wheezing: Secondary | ICD-10-CM | POA: Diagnosis not present

## 2021-03-20 DIAGNOSIS — F418 Other specified anxiety disorders: Secondary | ICD-10-CM

## 2021-03-20 DIAGNOSIS — J40 Bronchitis, not specified as acute or chronic: Secondary | ICD-10-CM | POA: Diagnosis not present

## 2021-03-20 MED ORDER — CYCLOBENZAPRINE HCL 5 MG PO TABS: 5.0000 mg | ORAL_TABLET | Freq: Every day | ORAL | 0 refills | Status: DC

## 2021-03-20 MED ORDER — PREDNISONE 20 MG PO TABS: ORAL_TABLET | ORAL | 0 refills | Status: AC

## 2021-03-20 MED ORDER — AZITHROMYCIN 250 MG PO TABS: ORAL_TABLET | ORAL | 0 refills | Status: DC

## 2021-03-20 NOTE — Progress Notes (Signed)
Acute Office Visit  Subjective:    Patient ID: Ebony Palmer, female    DOB: Jun 22, 1961, 60 y.o.   MRN: [id number]  Chief Complaint  Patient presents with  . Back Pain    HPI Patient is in today for complaints of upper back pain since saturday and cough Pt states she feels a tenderness in her upper right back - took flexeril which helped but states it feels like it did in past when she had bronchitis She denies fever or malaise Pt denies smoking history but husband does smoke She is not using symbicort as directed  Past Medical History:  Diagnosis Date  . Arthritis   . Depression   . Hyperlipidemia   . Hypertension   . Thyroid disease     Past Surgical History:  Procedure Laterality Date  . BACK SURGERY    . CESAREAN SECTION    . CHOLECYSTECTOMY    . ROTATOR CUFF REPAIR Left     History reviewed. No pertinent family history.  Social History   Socioeconomic History  . Marital status: Married    Spouse name: Not on file  . Number of children: Not on file  . Years of education: Not on file  . Highest education level: Not on file  Occupational History  . Not on file  Tobacco Use  . Smoking status: Never Smoker  . Smokeless tobacco: Never Used  Vaping Use  . Vaping Use: Never used  Substance and Sexual Activity  . Alcohol use: Never  . Drug use: Never  . Sexual activity: Yes  Other Topics Concern  . Not on file  Social History Narrative  . Not on file   Social Determinants of Health   Financial Resource Strain: Not on file  Food Insecurity: Not on file  Transportation Needs: Not on file  Physical Activity: Not on file  Stress: Not on file  Social Connections: Not on file  Intimate Partner Violence: Not on file    Outpatient Medications Prior to Visit  Medication Sig Dispense Refill  . budesonide-formoterol (SYMBICORT) 160-4.5 MCG/ACT inhaler Symbicort 160 mcg-4.5 mcg/actuation HFA aerosol inhaler  INHALE 2 PUFFS TWICE A DAY RINSE MOUTH AFTER  USE    . busPIRone (BUSPAR) 5 MG tablet Take 1 tablet (5 mg total) by mouth 2 (two) times daily. 60 tablet 1  . DULoxetine (CYMBALTA) 60 MG capsule 1 capsule    . ergocalciferol (VITAMIN D2) 1.25 MG (50000 UT) capsule 1 capsule    . levothyroxine (SYNTHROID) 75 MCG tablet Take 75 mcg by mouth daily.    Marland Kitchen rOPINIRole (REQUIP) 0.5 MG tablet Take 0.5 mg by mouth at bedtime.    . rosuvastatin (CRESTOR) 5 MG tablet Take 5 mg by mouth daily.    Marland Kitchen telmisartan (MICARDIS) 80 MG tablet Take 80 mg by mouth daily.    . cyclobenzaprine (FLEXERIL) 5 MG tablet Take 1 tablet (5 mg total) by mouth at bedtime. 10 tablet 0   No facility-administered medications prior to visit.    Allergies  Allergen Reactions  . Codeine Nausea Only, Nausea And Vomiting and Rash    Review of Systems CONSTITUTIONAL: Negative for chills, fatigue, fever, unintentional weight gain and unintentional weight loss.  E/N/T: Negative for ear pain, nasal congestion and sore throat.  CARDIOVASCULAR: Negative for chest pain, dizziness, palpitations and pedal edema.  RESPIRATORY: see HPI GASTROINTESTINAL: Negative for abdominal pain, acid reflux symptoms, constipation, diarrhea, nausea and vomiting.  MSK: see HPI INTEGUMENTARY: Negative for rash.  PSYCHIATRIC: Negative for sleep disturbance and to question depression screen.  Negative for depression, negative for anhedonia.         Objective:    Physical Exam PHYSICAL EXAM:   VS: BP 114/62 (BP Location: Left Arm, Patient Position: Sitting, Cuff Size: Normal)   Pulse 91   Temp (!) 97 F (36.1 C) (Temporal)   Ht 5\' 4"  (1.626 m)   Wt 174 lb 9.6 oz (79.2 kg)   SpO2 98%   BMI 29.97 kg/m   GEN: Well nourished, well developed, in no acute distress  HEENT: normal external ears and nose - normal external auditory canals and TMS -- Lips, Teeth and Gums - normal  Oropharynx - normal mucosa, palate, and posterior pharynx Cardiac: RRR; no murmurs, rubs, or gallops,no edema -   Respiratory:  normal respiratory rate and pattern with no distress - normal breath sounds with no rales, rhonchi, wheezes or rubs Psych: euthymic mood, appropriate affect and demeanor  BP 114/62 (BP Location: Left Arm, Patient Position: Sitting, Cuff Size: Normal)   Pulse 91   Temp (!) 97 F (36.1 C) (Temporal)   Ht 5\' 4"  (1.626 m)   Wt 174 lb 9.6 oz (79.2 kg)   SpO2 98%   BMI 29.97 kg/m  Wt Readings from Last 3 Encounters:  03/20/21 174 lb 9.6 oz (79.2 kg)  02/26/21 173 lb (78.5 kg)    Health Maintenance Due  Topic Date Due  . Hepatitis C Screening  Never done  . HIV Screening  Never done  . TETANUS/TDAP  Never done  . PAP SMEAR-Modifier  Never done  . COLONOSCOPY (Pts 45-32yrs Insurance coverage will need to be confirmed)  Never done  . MAMMOGRAM  Never done  . COVID-19 Vaccine (3 - Booster for Moderna series) 12/22/2020    There are no preventive care reminders to display for this patient.   Lab Results  Component Value Date   TSH 4.540 (H) 02/26/2021   Lab Results  Component Value Date   WBC 9.8 02/26/2021   HGB 14.4 02/26/2021   HCT 43.9 02/26/2021   MCV 92 02/26/2021   PLT 332 02/26/2021   Lab Results  Component Value Date   NA 140 02/26/2021   K 5.3 (H) 02/26/2021   CO2 24 02/26/2021   GLUCOSE 96 02/26/2021   BUN 10 02/26/2021   CREATININE 1.22 (H) 02/26/2021   BILITOT 0.3 02/26/2021   ALKPHOS 96 02/26/2021   AST 22 02/26/2021   ALT 20 02/26/2021   PROT 7.3 02/26/2021   ALBUMIN 4.7 02/26/2021   CALCIUM 10.3 (H) 02/26/2021   Lab Results  Component Value Date   CHOL 173 02/26/2021   Lab Results  Component Value Date   HDL 46 02/26/2021   Lab Results  Component Value Date   LDLCALC 108 (H) 02/26/2021   Lab Results  Component Value Date   TRIG 106 02/26/2021   Lab Results  Component Value Date   CHOLHDL 3.8 02/26/2021   No results found for: HGBA1C     Assessment & Plan:  1. Bronchitis - DG Chest 2 View - azithromycin  (ZITHROMAX) 250 MG tablet; 2 po day one then one po days 2-5  Dispense: 6 each; Refill: 0 - predniSONE (DELTASONE) 20 MG tablet; Take 3 tablets (60 mg total) by mouth daily with breakfast for 3 days, THEN 2 tablets (40 mg total) daily with breakfast for 3 days, THEN 1 tablet (20 mg total) daily with breakfast for 3 days.  Dispense: 18 tablet; Refill: 0  2. Acute right-sided thoracic back pain - cyclobenzaprine (FLEXERIL) 5 MG tablet; Take 1 tablet (5 mg total) by mouth at bedtime.  Dispense: 20 tablet; Refill: 0    Meds ordered this encounter  Medications  . azithromycin (ZITHROMAX) 250 MG tablet    Sig: 2 po day one then one po days 2-5    Dispense:  6 each    Refill:  0    Order Specific Question:   Supervising Provider    AnswerCorey Harold  . predniSONE (DELTASONE) 20 MG tablet    Sig: Take 3 tablets (60 mg total) by mouth daily with breakfast for 3 days, THEN 2 tablets (40 mg total) daily with breakfast for 3 days, THEN 1 tablet (20 mg total) daily with breakfast for 3 days.    Dispense:  18 tablet    Refill:  0    Order Specific Question:   Supervising Provider    AnswerBlane Ohara [id number]  . cyclobenzaprine (FLEXERIL) 5 MG tablet    Sig: Take 1 tablet (5 mg total) by mouth at bedtime.    Dispense:  20 tablet    Refill:  0    Order Specific Question:   Supervising Provider    AnswerCorey Harold    Orders Placed This Encounter  Procedures  . DG Chest 2 View     Follow up if symptoms change or worsen Follow-up: Return if symptoms worsen or fail to improve.  An After Visit Summary was printed and given to the patient.  Jettie Pagan [facility] [phone]

## 2021-03-28 ENCOUNTER — Other Ambulatory Visit: Payer: Self-pay

## 2021-03-28 MED ORDER — ROSUVASTATIN CALCIUM 5 MG PO TABS: 5.0000 mg | ORAL_TABLET | Freq: Every day | ORAL | 0 refills | Status: DC

## 2021-03-28 MED ORDER — DULOXETINE HCL 60 MG PO CPEP: 60.0000 mg | ORAL_CAPSULE | Freq: Every day | ORAL | 0 refills | Status: DC

## 2021-03-28 MED ORDER — TELMISARTAN 80 MG PO TABS: 80.0000 mg | ORAL_TABLET | Freq: Every day | ORAL | 0 refills | Status: DC

## 2021-04-23 ENCOUNTER — Other Ambulatory Visit: Payer: Self-pay | Admitting: Physician Assistant

## 2021-04-25 DIAGNOSIS — M654 Radial styloid tenosynovitis [de Quervain]: Secondary | ICD-10-CM | POA: Diagnosis not present

## 2021-04-29 ENCOUNTER — Other Ambulatory Visit: Payer: Self-pay

## 2021-04-29 MED ORDER — LEVOTHYROXINE SODIUM 75 MCG PO TABS: 75.0000 ug | ORAL_TABLET | Freq: Every day | ORAL | 2 refills | Status: DC

## 2021-05-31 ENCOUNTER — Ambulatory Visit: Payer: BC Managed Care – PPO | Admitting: Physician Assistant

## 2021-06-03 ENCOUNTER — Other Ambulatory Visit: Payer: Self-pay | Admitting: Physician Assistant

## 2021-06-03 NOTE — Telephone Encounter (Signed)
Notify no refills of meds --- pt no showed on 6/3 Needs to make appt

## 2021-06-04 NOTE — Telephone Encounter (Signed)
Left message for patient to call office to schedule appointment.

## 2021-07-15 ENCOUNTER — Other Ambulatory Visit: Payer: Self-pay | Admitting: Physician Assistant

## 2021-07-16 ENCOUNTER — Ambulatory Visit (INDEPENDENT_AMBULATORY_CARE_PROVIDER_SITE_OTHER): Payer: BC Managed Care – PPO

## 2021-07-16 ENCOUNTER — Encounter: Payer: Self-pay | Admitting: Physician Assistant

## 2021-07-16 ENCOUNTER — Ambulatory Visit (INDEPENDENT_AMBULATORY_CARE_PROVIDER_SITE_OTHER): Payer: BC Managed Care – PPO | Admitting: Physician Assistant

## 2021-07-16 ENCOUNTER — Other Ambulatory Visit: Payer: Self-pay

## 2021-07-16 ENCOUNTER — Other Ambulatory Visit: Payer: Self-pay | Admitting: Physician Assistant

## 2021-07-16 VITALS — BP 130/82 | HR 72 | Temp 97.7°F | Ht 64.0 in | Wt 170.0 lb

## 2021-07-16 DIAGNOSIS — E039 Hypothyroidism, unspecified: Secondary | ICD-10-CM

## 2021-07-16 DIAGNOSIS — I10 Essential (primary) hypertension: Secondary | ICD-10-CM

## 2021-07-16 DIAGNOSIS — Z23 Encounter for immunization: Secondary | ICD-10-CM | POA: Diagnosis not present

## 2021-07-16 DIAGNOSIS — F418 Other specified anxiety disorders: Secondary | ICD-10-CM

## 2021-07-16 DIAGNOSIS — E782 Mixed hyperlipidemia: Secondary | ICD-10-CM | POA: Diagnosis not present

## 2021-07-16 DIAGNOSIS — E559 Vitamin D deficiency, unspecified: Secondary | ICD-10-CM | POA: Diagnosis not present

## 2021-07-16 MED ORDER — BUSPIRONE HCL 5 MG PO TABS: 5.0000 mg | ORAL_TABLET | Freq: Two times a day (BID) | ORAL | 1 refills | Status: DC

## 2021-07-16 MED ORDER — DULOXETINE HCL 60 MG PO CPEP: ORAL_CAPSULE | ORAL | 1 refills | Status: DC

## 2021-07-16 MED ORDER — ROPINIROLE HCL 0.5 MG PO TABS: 0.5000 mg | ORAL_TABLET | Freq: Every day | ORAL | 1 refills | Status: DC

## 2021-07-16 MED ORDER — ERGOCALCIFEROL 1.25 MG (50000 UT) PO CAPS
ORAL_CAPSULE | ORAL | 1 refills | Status: DC
Start: 2021-07-16 — End: 2021-07-16

## 2021-07-16 MED ORDER — ROSUVASTATIN CALCIUM 5 MG PO TABS: 5.0000 mg | ORAL_TABLET | Freq: Every day | ORAL | 1 refills | Status: DC

## 2021-07-16 MED ORDER — LEVOTHYROXINE SODIUM 75 MCG PO TABS: 75.0000 ug | ORAL_TABLET | Freq: Every day | ORAL | 1 refills | Status: DC

## 2021-07-16 NOTE — Progress Notes (Signed)
New Patient Office Visit  Subjective:  Patient ID: Ebony Palmer, female    DOB: 1961-11-06  Age: 60 y.o. MRN: [id number]  CC:  Chief Complaint  Patient presents with   Hypertension   Hyperlipidemia   Hypothyroidism    HPI Pt with history of hypothyroidism - currently on levothyroxine 22mg qd - voices no concerns or problems  Pt presents for follow up of hypertension.The patient is tolerating the medication well without side effects. Compliance with treatment has been fair; she is currently on telmesartan 873mqd  Mixed hyperlipidemia  Pt presents with hyperlipidemia. . Compliance with treatment has been fair - she is currently on crestor 8m68md   Pt with history of vit D deficiency - is on weekly supplements - due for labwork  Pt with history of restless leg syndrome - stable on current med of requip 0.8mg48mast Medical History:  Diagnosis Date   Arthritis    Depression    Hyperlipidemia    Hypertension    Thyroid disease     Past Surgical History:  Procedure Laterality Date   BACK SURGERY     CESAREAN SECTION     CHOLECYSTECTOMY     ROTATOR CUFF REPAIR Left     History reviewed. No pertinent family history.  Social History   Socioeconomic History   Marital status: Married    Spouse name: Not on file   Number of children: Not on file   Years of education: Not on file   Highest education level: Not on file  Occupational History   Not on file  Tobacco Use   Smoking status: Never   Smokeless tobacco: Never  Vaping Use   Vaping Use: Never used  Substance and Sexual Activity   Alcohol use: Never   Drug use: Never   Sexual activity: Yes  Other Topics Concern   Not on file  Social History Narrative   Not on file   Social Determinants of Health   Financial Resource Strain: Not on file  Food Insecurity: Not on file  Transportation Needs: Not on file  Physical Activity: Not on file  Stress: Not on file  Social Connections: Not on file  Intimate  Partner Violence: Not on file     Current Outpatient Medications:    telmisartan (MICARDIS) 80 MG tablet, TAKE 1 TABLET BY MOUTH EVERY DAY, Disp: 90 tablet, Rfl: 0   busPIRone (BUSPAR) 5 MG tablet, Take 1 tablet (5 mg total) by mouth 2 (two) times daily., Disp: 180 tablet, Rfl: 1   DULoxetine (CYMBALTA) 60 MG capsule, TAKE 1 CAPSULE BY MOUTH EVERY DAY, Disp: 90 capsule, Rfl: 1   ergocalciferol (VITAMIN D2) 1.25 MG (50000 UT) capsule, 1 capsule, Disp: 12 capsule, Rfl: 1   levothyroxine (SYNTHROID) 75 MCG tablet, Take 1 tablet (75 mcg total) by mouth daily., Disp: 90 tablet, Rfl: 1   rOPINIRole (REQUIP) 0.5 MG tablet, Take 1 tablet (0.5 mg total) by mouth at bedtime., Disp: 90 tablet, Rfl: 1   rosuvastatin (CRESTOR) 5 MG tablet, Take 1 tablet (5 mg total) by mouth daily., Disp: 90 tablet, Rfl: 1   Allergies  Allergen Reactions   Codeine Nausea Only, Nausea And Vomiting and Rash    ROS CONSTITUTIONAL: Negative for chills, fatigue, fever, unintentional weight gain and unintentional weight loss.  CARDIOVASCULAR: Negative for chest pain, dizziness, palpitations and pedal edema.  RESPIRATORY: Negative for recent cough and dyspnea.  GASTROINTESTINAL: Negative for abdominal pain, acid reflux symptoms, constipation, diarrhea, nausea  and vomiting.  MSK: Negative for arthralgias and myalgias.  INTEGUMENTARY: Negative for rash.  NEUROLOGICAL: Negative for dizziness and headaches.  PSYCHIATRIC: Negative for sleep disturbance and to question depression screen.  Negative for depression, negative for anhedonia.      Objective:    PHYSICAL EXAM:   VS: BP 130/82 (BP Location: Left Arm, Patient Position: Sitting)   Pulse 72   Temp 97.7 F (36.5 C) (Temporal)   Ht _0  (1.626 m)   Wt 170 lb (77.1 kg)   SpO2 99%   BMI 29.18 kg/m   GEN: Well nourished, well developed, in no acute distress  Cardiac: RRR; no murmurs, rubs, or gallops,no edema -  Respiratory:  normal respiratory rate and pattern  with no distress - normal breath sounds with no rales, rhonchi, wheezes or rubs MS: no deformity or atrophy  Skin: warm and dry, no rash  Neuro:  Alert and Oriented x 3, Strength and sensation are intact - CN II-Xii grossly intact Psych: euthymic mood, appropriate affect and demeanor  Wt Readings from Last 3 Encounters:  07/16/21 170 lb (77.1 kg)  03/20/21 174 lb 9.6 oz (79.2 kg)  02/26/21 173 lb (78.5 kg)     Health Maintenance Due  Topic Date Due   PAP SMEAR-Modifier  Never done   COLONOSCOPY (Pts 45-11yr Insurance coverage will need to be confirmed)  Never done   MAMMOGRAM  Never done   COVID-19 Vaccine (3 - Booster for Moderna series) 11/22/2020    There are no preventive care reminders to display for this patient.  Lab Results  Component Value Date   TSH 4.540 (H) 02/26/2021   Lab Results  Component Value Date   WBC 9.8 02/26/2021   HGB 14.4 02/26/2021   HCT 43.9 02/26/2021   MCV 92 02/26/2021   PLT 332 02/26/2021   Lab Results  Component Value Date   NA 140 02/26/2021   K 5.3 (H) 02/26/2021   CO2 24 02/26/2021   GLUCOSE 96 02/26/2021   BUN 10 02/26/2021   CREATININE 1.22 (H) 02/26/2021   BILITOT 0.3 02/26/2021   ALKPHOS 96 02/26/2021   AST 22 02/26/2021   ALT 20 02/26/2021   PROT 7.3 02/26/2021   ALBUMIN 4.7 02/26/2021   CALCIUM 10.3 (H) 02/26/2021   EGFR 52 (L) 02/26/2021   Lab Results  Component Value Date   CHOL 173 02/26/2021   Lab Results  Component Value Date   HDL 46 02/26/2021   Lab Results  Component Value Date   LDLCALC 108 (H) 02/26/2021   Lab Results  Component Value Date   TRIG 106 02/26/2021   Lab Results  Component Value Date   CHOLHDL 3.8 02/26/2021   No results found for: HGBA1C    Assessment & Plan:   Problem List Items Addressed This Visit       Endocrine   Hypothyroidism - Primary   Relevant Medications   levothyroxine (SYNTHROID) 75 MCG tablet   Other Relevant Orders   TSH Continue current meds                             Other   Depressive disorder   Relevant Medications   DULoxetine (CYMBALTA) 60 MG capsule   busPIRone (BUSPAR) 5 MG tablet Follow up in 3 months or sooner if symptoms change or worsen   Hyperlipidemia   Relevant Medications   rosuvastatin (CRESTOR) 5 MG tablet   telmisartan (MICARDIS) 80  MG tablet Continue meds and watch diet   Other Relevant Orders   Lipid panel      Other Visit Diagnoses     Benign hypertension       Relevant Medications   rosuvastatin (CRESTOR) 5 MG tablet   telmisartan (MICARDIS) 80 MG tablet labwork pending   Other Relevant Orders   CBC with Differential/Platelet   Comprehensive metabolic panel   Vitamin D insufficiency       Relevant Orders   VITAMIN D 25 Hydroxy (Vit-D Deficiency, Fractures) Continue weekly supplements            Depression with anxiety       Relevant Medications   DULoxetine (CYMBALTA) 60 MG capsule   busPIRone (BUSPAR) 5 MG tablet       Meds ordered this encounter  Medications   busPIRone (BUSPAR) 5 MG tablet    Sig: Take 1 tablet (5 mg total) by mouth 2 (two) times daily.    Dispense:  180 tablet    Refill:  1    Order Specific Question:   Supervising Provider    Answer:   Shelton Silvas   DULoxetine (CYMBALTA) 60 MG capsule    Sig: TAKE 1 CAPSULE BY MOUTH EVERY DAY    Dispense:  90 capsule    Refill:  1    Order Specific Question:   Supervising Provider    AnswerShelton Silvas   ergocalciferol (VITAMIN D2) 1.25 MG (50000 UT) capsule    Sig: 1 capsule    Dispense:  12 capsule    Refill:  1    Order Specific Question:   Supervising Provider    AnswerShelton Silvas   levothyroxine (SYNTHROID) 75 MCG tablet    Sig: Take 1 tablet (75 mcg total) by mouth daily.    Dispense:  90 tablet    Refill:  1    Order Specific Question:   Supervising Provider    Answer:   Shelton Silvas   rOPINIRole (REQUIP) 0.5 MG tablet    Sig: Take 1 tablet (0.5 mg total) by  mouth at bedtime.    Dispense:  90 tablet    Refill:  1    Order Specific Question:   Supervising Provider    Answer:   Shelton Silvas   rosuvastatin (CRESTOR) 5 MG tablet    Sig: Take 1 tablet (5 mg total) by mouth daily.    Dispense:  90 tablet    Refill:  1    Order Specific Question:   Supervising Provider    Answer:   Shelton Silvas    Follow-up: Return in about 6 months (around 01/16/2022) for chronic fasting follow up.    SARA R Taner Rzepka, PA-C

## 2021-07-17 ENCOUNTER — Other Ambulatory Visit: Payer: Self-pay | Admitting: Physician Assistant

## 2021-07-17 DIAGNOSIS — E039 Hypothyroidism, unspecified: Secondary | ICD-10-CM

## 2021-07-17 LAB — LIPID PANEL
Chol/HDL Ratio: 4.2 ratio (ref 0.0–4.4)
Cholesterol, Total: 187 mg/dL (ref 100–199)
HDL: 45 mg/dL (ref >39)
LDL Chol Calc (NIH): 121 mg/dL — ABNORMAL HIGH (ref 0–99)
Triglycerides: 118 mg/dL (ref 0–149)
VLDL Cholesterol Cal: 21 mg/dL (ref 5–40)

## 2021-07-17 LAB — CBC WITH DIFFERENTIAL/PLATELET
Basophils Absolute: 0.1 10*3/uL (ref 0.0–0.2)
Basos: 1 %
EOS (ABSOLUTE): 0.2 10*3/uL (ref 0.0–0.4)
Eos: 3 %
Hematocrit: 44.4 % (ref 34.0–46.6)
Hemoglobin: 14 g/dL (ref 11.1–15.9)
Immature Grans (Abs): 0 10*3/uL (ref 0.0–0.1)
Immature Granulocytes: 0 %
Lymphocytes Absolute: 1.9 10*3/uL (ref 0.7–3.1)
Lymphs: 23 %
MCH: 29.4 pg (ref 26.6–33.0)
MCHC: 31.5 g/dL (ref 31.5–35.7)
MCV: 93 fL (ref 79–97)
Monocytes Absolute: 0.8 10*3/uL (ref 0.1–0.9)
Monocytes: 10 %
Neutrophils Absolute: 5 10*3/uL (ref 1.4–7.0)
Neutrophils: 63 %
Platelets: 282 10*3/uL (ref 150–450)
RBC: 4.77 x10E6/uL (ref 3.77–5.28)
RDW: 12.6 % (ref 11.7–15.4)
WBC: 8 10*3/uL (ref 3.4–10.8)

## 2021-07-17 LAB — COMPREHENSIVE METABOLIC PANEL
ALT: 22 IU/L (ref 0–32)
AST: 23 IU/L (ref 0–40)
Albumin/Globulin Ratio: 1.8 (ref 1.2–2.2)
Albumin: 4.6 g/dL (ref 3.8–4.9)
Alkaline Phosphatase: 100 IU/L (ref 44–121)
BUN/Creatinine Ratio: 11 (ref 9–23)
BUN: 12 mg/dL (ref 6–24)
Bilirubin Total: 0.4 mg/dL (ref 0.0–1.2)
CO2: 27 mmol/L (ref 20–29)
Calcium: 10 mg/dL (ref 8.7–10.2)
Chloride: 101 mmol/L (ref 96–106)
Creatinine, Ser: 1.11 mg/dL — ABNORMAL HIGH (ref 0.57–1.00)
Globulin, Total: 2.5 g/dL (ref 1.5–4.5)
Glucose: 114 mg/dL — ABNORMAL HIGH (ref 65–99)
Potassium: 5.1 mmol/L (ref 3.5–5.2)
Sodium: 140 mmol/L (ref 134–144)
Total Protein: 7.1 g/dL (ref 6.0–8.5)
eGFR: 58 mL/min/{1.73_m2} — ABNORMAL LOW (ref >59)

## 2021-07-17 LAB — TSH: TSH: 5.7 u[IU]/mL — ABNORMAL HIGH (ref 0.450–4.500)

## 2021-07-17 LAB — CARDIOVASCULAR RISK ASSESSMENT

## 2021-07-17 LAB — VITAMIN D 25 HYDROXY (VIT D DEFICIENCY, FRACTURES): Vit D, 25-Hydroxy: 47.4 ng/mL (ref 30.0–100.0)

## 2021-07-17 MED ORDER — LEVOTHYROXINE SODIUM 88 MCG PO TABS: 88.0000 ug | ORAL_TABLET | Freq: Every day | ORAL | 2 refills | Status: DC

## 2021-07-17 MED ORDER — ROSUVASTATIN CALCIUM 10 MG PO TABS: 10.0000 mg | ORAL_TABLET | Freq: Every day | ORAL | 0 refills | Status: DC

## 2021-09-11 ENCOUNTER — Other Ambulatory Visit: Payer: BC Managed Care – PPO

## 2021-09-11 ENCOUNTER — Other Ambulatory Visit: Payer: Self-pay

## 2021-09-11 DIAGNOSIS — E039 Hypothyroidism, unspecified: Secondary | ICD-10-CM | POA: Diagnosis not present

## 2021-09-11 DIAGNOSIS — S63602A Unspecified sprain of left thumb, initial encounter: Secondary | ICD-10-CM | POA: Diagnosis not present

## 2021-09-12 LAB — TSH: TSH: 2.32 u[IU]/mL (ref 0.450–4.500)

## 2021-09-24 ENCOUNTER — Other Ambulatory Visit: Payer: Self-pay | Admitting: Physician Assistant

## 2021-10-17 ENCOUNTER — Other Ambulatory Visit: Payer: Self-pay | Admitting: Physician Assistant

## 2021-10-17 DIAGNOSIS — M546 Pain in thoracic spine: Secondary | ICD-10-CM

## 2021-10-25 ENCOUNTER — Other Ambulatory Visit: Payer: Self-pay | Admitting: Physician Assistant

## 2021-10-31 ENCOUNTER — Other Ambulatory Visit: Payer: Self-pay | Admitting: Physician Assistant

## 2022-01-11 ENCOUNTER — Other Ambulatory Visit: Payer: Self-pay | Admitting: Physician Assistant

## 2022-01-11 DIAGNOSIS — F418 Other specified anxiety disorders: Secondary | ICD-10-CM

## 2022-01-16 ENCOUNTER — Ambulatory Visit: Payer: BC Managed Care – PPO | Admitting: Physician Assistant

## 2022-01-17 ENCOUNTER — Other Ambulatory Visit: Payer: Self-pay | Admitting: Physician Assistant

## 2022-01-17 DIAGNOSIS — E559 Vitamin D deficiency, unspecified: Secondary | ICD-10-CM

## 2022-01-25 ENCOUNTER — Other Ambulatory Visit: Payer: Self-pay | Admitting: Physician Assistant

## 2022-01-28 ENCOUNTER — Encounter: Payer: Self-pay | Admitting: Physician Assistant

## 2022-01-28 ENCOUNTER — Ambulatory Visit (INDEPENDENT_AMBULATORY_CARE_PROVIDER_SITE_OTHER): Payer: BC Managed Care – PPO | Admitting: Physician Assistant

## 2022-01-28 ENCOUNTER — Other Ambulatory Visit: Payer: Self-pay

## 2022-01-28 VITALS — BP 110/70 | HR 80 | Temp 97.9°F | Ht 64.0 in | Wt 171.8 lb

## 2022-01-28 DIAGNOSIS — E782 Mixed hyperlipidemia: Secondary | ICD-10-CM | POA: Diagnosis not present

## 2022-01-28 DIAGNOSIS — G2581 Restless legs syndrome: Secondary | ICD-10-CM

## 2022-01-28 DIAGNOSIS — E559 Vitamin D deficiency, unspecified: Secondary | ICD-10-CM

## 2022-01-28 DIAGNOSIS — Z23 Encounter for immunization: Secondary | ICD-10-CM | POA: Diagnosis not present

## 2022-01-28 DIAGNOSIS — E039 Hypothyroidism, unspecified: Secondary | ICD-10-CM

## 2022-01-28 DIAGNOSIS — Z1231 Encounter for screening mammogram for malignant neoplasm of breast: Secondary | ICD-10-CM

## 2022-01-28 DIAGNOSIS — I10 Essential (primary) hypertension: Secondary | ICD-10-CM | POA: Diagnosis not present

## 2022-01-28 MED ORDER — ROPINIROLE HCL 1 MG PO TABS: 1.0000 mg | ORAL_TABLET | Freq: Every day | ORAL | 1 refills | Status: DC

## 2022-01-28 NOTE — Progress Notes (Signed)
New Patient Office Visit  Subjective:  Patient ID: Ebony Palmer, female    DOB: 31-Mar-1961  Age: 61 y.o. MRN: [id number]  CC:  Chief Complaint  Patient presents with   Hypothyroidism   Hypertension    HPI Pt with history of hypothyroidism - currently on levothyroxine 63mcg qd - voices no concerns or problems  Pt presents for follow up of hypertension.The patient is tolerating the medication well without side effects. Compliance with treatment has been fair; she is currently on telmesartan $RemoveBefore'80mg'uIreLdObbIAkg$  qd  Mixed hyperlipidemia  Pt presents with hyperlipidemia. . Compliance with treatment has been fair - she is currently on crestor $RemoveBe'10mg'WbwtVPyWb$  qd   Pt with history of vit D deficiency - is on weekly supplements - due for labwork  Pt with history of depression with anxiety - current meds include cymbalta $RemoveBeforeDE'60mg'zvYbQqhbPfndflj$  qd and buspar $RemoveBe'5mg'oaFfgTpgc$  bid - states these medications working well for her and has no concerns  Pt with history of restless leg syndrome - is currently on 0.$RemoveBefore'5mg'xQdCCgvNQBFYJ$  and states it works but thinks could benefit from increased dose  Pt would like flu shot today Pt would like to be scheduled for mammogram  Pt with history of restless leg syndrome - stable on current med of requip 0.$RemoveBef'5mg'wBGjZcLJqD$   Past Medical History:  Diagnosis Date   Arthritis    Depression    Hyperlipidemia    Hypertension    Thyroid disease     Past Surgical History:  Procedure Laterality Date   BACK SURGERY     CESAREAN SECTION     CHOLECYSTECTOMY     ROTATOR CUFF REPAIR Left     History reviewed. No pertinent family history.  Social History   Socioeconomic History   Marital status: Married    Spouse name: Not on file   Number of children: Not on file   Years of education: Not on file   Highest education level: Not on file  Occupational History   Not on file  Tobacco Use   Smoking status: Never   Smokeless tobacco: Never  Vaping Use   Vaping Use: Never used  Substance and Sexual Activity   Alcohol use: Never    Drug use: Never   Sexual activity: Yes  Other Topics Concern   Not on file  Social History Narrative   Not on file   Social Determinants of Health   Financial Resource Strain: Not on file  Food Insecurity: Not on file  Transportation Needs: Not on file  Physical Activity: Not on file  Stress: Not on file  Social Connections: Not on file  Intimate Partner Violence: Not on file     Current Outpatient Medications:    busPIRone (BUSPAR) 5 MG tablet, Take 1 tablet (5 mg total) by mouth 2 (two) times daily. (Patient taking differently: Take 5 mg by mouth daily.), Disp: 180 tablet, Rfl: 1   DULoxetine (CYMBALTA) 60 MG capsule, TAKE 1 CAPSULE BY MOUTH EVERY DAY, Disp: 90 capsule, Rfl: 0   levothyroxine (SYNTHROID) 88 MCG tablet, TAKE 1 TABLET BY MOUTH EVERY DAY, Disp: 30 tablet, Rfl: 2   rOPINIRole (REQUIP) 1 MG tablet, Take 1 tablet (1 mg total) by mouth at bedtime., Disp: 90 tablet, Rfl: 1   rosuvastatin (CRESTOR) 10 MG tablet, TAKE 1 TABLET BY MOUTH EVERY DAY, Disp: 90 tablet, Rfl: 0   telmisartan (MICARDIS) 80 MG tablet, Take 1 tablet (80 mg total) by mouth daily., Disp: 90 tablet, Rfl: 1   Vitamin D, Ergocalciferol, (DRISDOL) 1.25  MG (50000 UNIT) CAPS capsule, 1 CAPSULE weekly, Disp: 12 capsule, Rfl: 1   Allergies  Allergen Reactions   Codeine Nausea Only, Nausea And Vomiting and Rash    CONSTITUTIONAL: Negative for chills, fatigue, fever, unintentional weight gain and unintentional weight loss.  E/N/T: Negative for ear pain, nasal congestion and sore throat.  CARDIOVASCULAR: Negative for chest pain, dizziness, palpitations and pedal edema.  RESPIRATORY: Negative for recent cough and dyspnea.  GASTROINTESTINAL: Negative for abdominal pain, acid reflux symptoms, constipation, diarrhea, nausea and vomiting.  MSK: see HPI INTEGUMENTARY: Negative for rash.  NEUROLOGICAL: Negative for dizziness and headaches.  PSYCHIATRIC: Negative for sleep disturbance and to question depression  screen.  Negative for depression, negative for anhedonia.      Objective:  PHYSICAL EXAM:   VS: BP 110/70    Pulse 80    Temp 97.9 F (36.6 C)    Ht $R'5\' 4"'yp$  (1.626 m)    Wt 171 lb 12.8 oz (77.9 kg)    SpO2 98%    BMI 29.49 kg/m   GEN: Well nourished, well developed, in no acute distress  Cardiac: RRR; no murmurs, rubs, or gallops,no edema -  Respiratory:  normal respiratory rate and pattern with no distress - normal breath sounds with no rales, rhonchi, wheezes or rubs Skin: warm and dry, no rash  Psych: euthymic mood, appropriate affect and demeanor   There are no preventive care reminders to display for this patient.   There are no preventive care reminders to display for this patient.  Lab Results  Component Value Date   TSH 2.320 09/11/2021   Lab Results  Component Value Date   WBC 8.0 07/16/2021   HGB 14.0 07/16/2021   HCT 44.4 07/16/2021   MCV 93 07/16/2021   PLT 282 07/16/2021   Lab Results  Component Value Date   NA 140 07/16/2021   K 5.1 07/16/2021   CO2 27 07/16/2021   GLUCOSE 114 (H) 07/16/2021   BUN 12 07/16/2021   CREATININE 1.11 (H) 07/16/2021   BILITOT 0.4 07/16/2021   ALKPHOS 100 07/16/2021   AST 23 07/16/2021   ALT 22 07/16/2021   PROT 7.1 07/16/2021   ALBUMIN 4.6 07/16/2021   CALCIUM 10.0 07/16/2021   EGFR 58 (L) 07/16/2021   Lab Results  Component Value Date   CHOL 187 07/16/2021   Lab Results  Component Value Date   HDL 45 07/16/2021   Lab Results  Component Value Date   LDLCALC 121 (H) 07/16/2021   Lab Results  Component Value Date   TRIG 118 07/16/2021   Lab Results  Component Value Date   CHOLHDL 4.2 07/16/2021   No results found for: HGBA1C    Assessment & Plan:   Problem List Items Addressed This Visit       Endocrine   Hypothyroidism - Primary   Relevant Medications      Other Relevant Orders   TSH Continue current meds                            Other   Depressive disorder   Relevant Medications    DULoxetine (CYMBALTA) 60 MG capsule   busPIRone (BUSPAR) 5 MG tablet Follow up in 6 months or sooner if symptoms change or worsen   Hyperlipidemia   Relevant Medications   rosuvastatin (CRESTOR) 5 MG tablet   telmisartan (MICARDIS) 80 MG tablet Continue meds and watch diet   Other Relevant Orders  Lipid panel      Other Visit Diagnoses     Benign hypertension       Relevant Medications   rosuvastatin (CRESTOR) 5 MG tablet   telmisartan (MICARDIS) 80 MG tablet labwork pending   Other Relevant Orders   CBC with Differential/Platelet   Comprehensive metabolic panel   Vitamin D insufficiency       Relevant Orders   VITAMIN D 25 Hydroxy (Vit-D Deficiency, Fractures) Continue weekly supplements            Depression with anxiety       Relevant Medications   DULoxetine (CYMBALTA) 60 MG capsule   busPIRone (BUSPAR) 5 MG tablet  Restless leg syndrome Requip $RemoveBeforeD'1mg'BmqqRfjAvTObNa$  qd  Flucelvax given  Mammogram scheduled     Meds ordered this encounter  Medications   rOPINIRole (REQUIP) 1 MG tablet    Sig: Take 1 tablet (1 mg total) by mouth at bedtime.    Dispense:  90 tablet    Refill:  1    Order Specific Question:   Supervising Provider    Answer:   Shelton Silvas    Follow-up: Return in about 6 months (around 07/28/2022) for chronic fasting follow up.    SARA R Rathana Viveros, PA-C

## 2022-01-29 ENCOUNTER — Other Ambulatory Visit: Payer: Self-pay | Admitting: Physician Assistant

## 2022-01-29 DIAGNOSIS — E039 Hypothyroidism, unspecified: Secondary | ICD-10-CM

## 2022-01-29 LAB — CBC WITH DIFFERENTIAL/PLATELET
Basophils Absolute: 0.1 10*3/uL (ref 0.0–0.2)
Basos: 1 %
EOS (ABSOLUTE): 0.4 10*3/uL (ref 0.0–0.4)
Eos: 4 %
Hematocrit: 43.7 % (ref 34.0–46.6)
Hemoglobin: 14.4 g/dL (ref 11.1–15.9)
Immature Grans (Abs): 0 10*3/uL (ref 0.0–0.1)
Immature Granulocytes: 0 %
Lymphocytes Absolute: 2.8 10*3/uL (ref 0.7–3.1)
Lymphs: 27 %
MCH: 29.1 pg (ref 26.6–33.0)
MCHC: 33 g/dL (ref 31.5–35.7)
MCV: 88 fL (ref 79–97)
Monocytes Absolute: 1 10*3/uL — ABNORMAL HIGH (ref 0.1–0.9)
Monocytes: 10 %
Neutrophils Absolute: 6.1 10*3/uL (ref 1.4–7.0)
Neutrophils: 58 %
Platelets: 307 10*3/uL (ref 150–450)
RBC: 4.95 x10E6/uL (ref 3.77–5.28)
RDW: 12.7 % (ref 11.7–15.4)
WBC: 10.3 10*3/uL (ref 3.4–10.8)

## 2022-01-29 LAB — COMPREHENSIVE METABOLIC PANEL
ALT: 20 IU/L (ref 0–32)
AST: 22 IU/L (ref 0–40)
Albumin/Globulin Ratio: 1.7 (ref 1.2–2.2)
Albumin: 4.7 g/dL (ref 3.8–4.9)
Alkaline Phosphatase: 112 IU/L (ref 44–121)
BUN/Creatinine Ratio: 13 (ref 9–23)
BUN: 14 mg/dL (ref 6–24)
Bilirubin Total: 0.5 mg/dL (ref 0.0–1.2)
CO2: 26 mmol/L (ref 20–29)
Calcium: 10.2 mg/dL (ref 8.7–10.2)
Chloride: 100 mmol/L (ref 96–106)
Creatinine, Ser: 1.09 mg/dL — ABNORMAL HIGH (ref 0.57–1.00)
Globulin, Total: 2.7 g/dL (ref 1.5–4.5)
Glucose: 103 mg/dL — ABNORMAL HIGH (ref 70–99)
Potassium: 5.1 mmol/L (ref 3.5–5.2)
Sodium: 141 mmol/L (ref 134–144)
Total Protein: 7.4 g/dL (ref 6.0–8.5)
eGFR: 59 mL/min/{1.73_m2} — ABNORMAL LOW (ref >59)

## 2022-01-29 LAB — TSH: TSH: 0.302 u[IU]/mL — ABNORMAL LOW (ref 0.450–4.500)

## 2022-01-29 LAB — LIPID PANEL
Chol/HDL Ratio: 2.7 ratio (ref 0.0–4.4)
Cholesterol, Total: 137 mg/dL (ref 100–199)
HDL: 50 mg/dL (ref >39)
LDL Chol Calc (NIH): 68 mg/dL (ref 0–99)
Triglycerides: 106 mg/dL (ref 0–149)
VLDL Cholesterol Cal: 19 mg/dL (ref 5–40)

## 2022-01-29 LAB — VITAMIN D 25 HYDROXY (VIT D DEFICIENCY, FRACTURES): Vit D, 25-Hydroxy: 72.3 ng/mL (ref 30.0–100.0)

## 2022-01-29 LAB — CARDIOVASCULAR RISK ASSESSMENT

## 2022-01-29 MED ORDER — LEVOTHYROXINE SODIUM 75 MCG PO TABS
75.0000 ug | ORAL_TABLET | Freq: Every day | ORAL | 2 refills | Status: DC
Start: 2022-01-29 — End: 2022-08-26

## 2022-01-31 DIAGNOSIS — R051 Acute cough: Secondary | ICD-10-CM | POA: Diagnosis not present

## 2022-01-31 DIAGNOSIS — R0981 Nasal congestion: Secondary | ICD-10-CM | POA: Diagnosis not present

## 2022-01-31 DIAGNOSIS — M791 Myalgia, unspecified site: Secondary | ICD-10-CM | POA: Diagnosis not present

## 2022-01-31 DIAGNOSIS — J029 Acute pharyngitis, unspecified: Secondary | ICD-10-CM | POA: Diagnosis not present

## 2022-01-31 DIAGNOSIS — Z20828 Contact with and (suspected) exposure to other viral communicable diseases: Secondary | ICD-10-CM | POA: Diagnosis not present

## 2022-02-07 ENCOUNTER — Other Ambulatory Visit: Payer: Self-pay | Admitting: Physician Assistant

## 2022-02-07 DIAGNOSIS — Z1231 Encounter for screening mammogram for malignant neoplasm of breast: Secondary | ICD-10-CM

## 2022-02-09 DIAGNOSIS — R0981 Nasal congestion: Secondary | ICD-10-CM | POA: Diagnosis not present

## 2022-02-09 DIAGNOSIS — Z20828 Contact with and (suspected) exposure to other viral communicable diseases: Secondary | ICD-10-CM | POA: Diagnosis not present

## 2022-02-09 DIAGNOSIS — J0100 Acute maxillary sinusitis, unspecified: Secondary | ICD-10-CM | POA: Diagnosis not present

## 2022-02-09 DIAGNOSIS — R519 Headache, unspecified: Secondary | ICD-10-CM | POA: Diagnosis not present

## 2022-02-18 ENCOUNTER — Other Ambulatory Visit: Payer: Self-pay | Admitting: Physician Assistant

## 2022-02-18 DIAGNOSIS — E559 Vitamin D deficiency, unspecified: Secondary | ICD-10-CM

## 2022-02-19 ENCOUNTER — Other Ambulatory Visit: Payer: Self-pay

## 2022-02-19 DIAGNOSIS — F418 Other specified anxiety disorders: Secondary | ICD-10-CM

## 2022-02-19 MED ORDER — BUSPIRONE HCL 5 MG PO TABS: 5.0000 mg | ORAL_TABLET | Freq: Two times a day (BID) | ORAL | 0 refills | Status: DC

## 2022-03-26 ENCOUNTER — Other Ambulatory Visit: Payer: BC Managed Care – PPO

## 2022-03-28 ENCOUNTER — Other Ambulatory Visit (INDEPENDENT_AMBULATORY_CARE_PROVIDER_SITE_OTHER): Payer: BC Managed Care – PPO

## 2022-03-28 DIAGNOSIS — E039 Hypothyroidism, unspecified: Secondary | ICD-10-CM | POA: Diagnosis not present

## 2022-03-29 LAB — TSH: TSH: 3.56 u[IU]/mL (ref 0.450–4.500)

## 2022-04-23 ENCOUNTER — Ambulatory Visit
Admission: RE | Admit: 2022-04-23 | Discharge: 2022-04-23 | Disposition: A | Discharge status: Home / Self Care | Payer: BC Managed Care – PPO | Source: Ambulatory Visit | Attending: Physician Assistant | Admitting: Physician Assistant

## 2022-04-23 DIAGNOSIS — Z1231 Encounter for screening mammogram for malignant neoplasm of breast: Secondary | ICD-10-CM

## 2022-04-28 ENCOUNTER — Other Ambulatory Visit: Payer: Self-pay | Admitting: Physician Assistant

## 2022-04-29 ENCOUNTER — Other Ambulatory Visit: Payer: Self-pay | Admitting: Physician Assistant

## 2022-04-29 DIAGNOSIS — G2581 Restless legs syndrome: Secondary | ICD-10-CM

## 2022-05-17 ENCOUNTER — Other Ambulatory Visit: Payer: Self-pay | Admitting: Physician Assistant

## 2022-05-17 DIAGNOSIS — F418 Other specified anxiety disorders: Secondary | ICD-10-CM

## 2022-05-25 ENCOUNTER — Other Ambulatory Visit: Payer: Self-pay | Admitting: Family Medicine

## 2022-07-25 ENCOUNTER — Other Ambulatory Visit: Payer: Self-pay | Admitting: Physician Assistant

## 2022-07-29 DIAGNOSIS — G5603 Carpal tunnel syndrome, bilateral upper limbs: Secondary | ICD-10-CM | POA: Diagnosis not present

## 2022-08-06 ENCOUNTER — Ambulatory Visit: Payer: BC Managed Care – PPO | Admitting: Physician Assistant

## 2022-08-18 ENCOUNTER — Other Ambulatory Visit: Payer: Self-pay | Admitting: Physician Assistant

## 2022-08-18 ENCOUNTER — Other Ambulatory Visit: Payer: Self-pay | Admitting: Family Medicine

## 2022-08-18 DIAGNOSIS — F418 Other specified anxiety disorders: Secondary | ICD-10-CM

## 2022-08-25 ENCOUNTER — Ambulatory Visit (INDEPENDENT_AMBULATORY_CARE_PROVIDER_SITE_OTHER): Payer: BC Managed Care – PPO | Admitting: Physician Assistant

## 2022-08-25 ENCOUNTER — Encounter: Payer: Self-pay | Admitting: Physician Assistant

## 2022-08-25 VITALS — BP 128/74 | HR 72 | Temp 97.7°F | Resp 18 | Ht 64.0 in | Wt 174.6 lb

## 2022-08-25 DIAGNOSIS — E782 Mixed hyperlipidemia: Secondary | ICD-10-CM | POA: Diagnosis not present

## 2022-08-25 DIAGNOSIS — E038 Other specified hypothyroidism: Secondary | ICD-10-CM | POA: Diagnosis not present

## 2022-08-25 DIAGNOSIS — I10 Essential (primary) hypertension: Secondary | ICD-10-CM

## 2022-08-25 DIAGNOSIS — E559 Vitamin D deficiency, unspecified: Secondary | ICD-10-CM

## 2022-08-25 DIAGNOSIS — R739 Hyperglycemia, unspecified: Secondary | ICD-10-CM | POA: Diagnosis not present

## 2022-08-25 DIAGNOSIS — Z23 Encounter for immunization: Secondary | ICD-10-CM | POA: Diagnosis not present

## 2022-08-25 DIAGNOSIS — F418 Other specified anxiety disorders: Secondary | ICD-10-CM

## 2022-08-25 DIAGNOSIS — G2581 Restless legs syndrome: Secondary | ICD-10-CM

## 2022-08-25 MED ORDER — ROPINIROLE HCL 2 MG PO TABS: 2.0000 mg | ORAL_TABLET | Freq: Every day | ORAL | 1 refills | Status: DC

## 2022-08-25 NOTE — Progress Notes (Signed)
Subjective:  Patient ID: Ebony Palmer, female    DOB: 1961-12-16  Age: 61 y.o. MRN: [id number]  Chief Complaint  Patient presents with   Hypertension    HPI  Pt presents for follow up of hypertension.The patient is tolerating the medication well without side effects. Compliance with treatment has been good; including taking medication as directed , maintains a healthy diet and regular exercise regimen , and following up as directed. She is currently taking micardis 80mg  qd  Mixed hyperlipidemia  Pt presents with hyperlipidemia.  The patient is compliant with medications, maintains a low cholesterol diet , follows up as directed , and maintains an exercise regimen . The patient denies experiencing any hypercholesterolemia related symptoms. She is currently on crestor 10mg  qd  Pt uses requip 1mg  at night for restless leg syndrome - she has at times needed 2 to help with symptoms and that is being needed more nights to help with symptoms - will increase dosing  Pt with history of anxiety with depression - symptoms have been stable with current meds of cymbalta and buspar -   Pt with history of hypothyroidism - currently on synthroid qd - voices no concerns or problems  Pt would like to update tetanus today  Pt with history of Vit D def but has stopped supplement - due to recheck Current Outpatient Medications on File Prior to Visit  Medication Sig Dispense Refill   busPIRone (BUSPAR) 5 MG tablet TAKE 1 TABLET BY MOUTH TWICE A DAY 180 tablet 0   DULoxetine (CYMBALTA) 60 MG capsule TAKE 1 CAPSULE BY MOUTH EVERY DAY 90 capsule 0   levothyroxine (SYNTHROID) 75 MCG tablet Take 1 tablet (75 mcg total) by mouth daily. 30 tablet 2   rosuvastatin (CRESTOR) 10 MG tablet TAKE 1 TABLET BY MOUTH EVERY DAY 90 tablet 0   telmisartan (MICARDIS) 80 MG tablet TAKE 1 TABLET BY MOUTH EVERY DAY 90 tablet 1   No current facility-administered medications on file prior to visit.   Past Medical  History:  Diagnosis Date   Arthritis    Depression    Hyperlipidemia    Hypertension    Thyroid disease    Past Surgical History:  Procedure Laterality Date   BACK SURGERY     BREAST CYST ASPIRATION Right    2016   CESAREAN SECTION     CHOLECYSTECTOMY     ROTATOR CUFF REPAIR Left     History reviewed. No pertinent family history. Social History   Socioeconomic History   Marital status: Married    Spouse name: Not on file   Number of children: Not on file   Years of education: Not on file   Highest education level: Not on file  Occupational History   Not on file  Tobacco Use   Smoking status: Never   Smokeless tobacco: Never  Vaping Use   Vaping Use: Never used  Substance and Sexual Activity   Alcohol use: Never   Drug use: Never   Sexual activity: Yes  Other Topics Concern   Not on file  Social History Narrative   Not on file   Social Determinants of Health   Financial Resource Strain: Not on file  Food Insecurity: Not on file  Transportation Needs: Not on file  Physical Activity: Not on file  Stress: Not on file  Social Connections: Not on file    Review of Systems CONSTITUTIONAL: Negative for chills, fatigue, fever, unintentional weight gain and unintentional weight loss.  E/N/T: Negative for ear pain, nasal congestion and sore throat.  CARDIOVASCULAR: Negative for chest pain, dizziness, palpitations and pedal edema.  RESPIRATORY: Negative for recent cough and dyspnea.  GASTROINTESTINAL: Negative for abdominal pain, acid reflux symptoms, constipation, diarrhea, nausea and vomiting.  MSK: Negative for arthralgias and myalgias.  INTEGUMENTARY: Negative for rash.  NEUROLOGICAL: Negative for dizziness and headaches.  PSYCHIATRIC: Negative for sleep disturbance and to question depression screen.  Negative for depression, negative for anhedonia.       Objective:  PHYSICAL EXAM:   VS: BP 128/74   Pulse 72   Temp 97.7 F (36.5 C)   Resp 18   Ht 5\' 4"   (1.626 m)   Wt 174 lb 9.6 oz (79.2 kg)   SpO2 94%   BMI 29.97 kg/m   GEN: Well nourished, well developed, in no acute distress   Cardiac: RRR; no murmurs, rubs, or gallops,no edema - no significant varicosities Respiratory:  normal respiratory rate and pattern with no distress - normal breath sounds with no rales, rhonchi, wheezes or rubs  MS: no deformity or atrophy  Skin: warm and dry, no rash  Neuro:  Alert and Oriented x 3, - CN II-Xii grossly intact Psych: euthymic mood, appropriate affect and demeanor  Lab Results  Component Value Date   WBC 10.3 01/28/2022   HGB 14.4 01/28/2022   HCT 43.7 01/28/2022   PLT 307 01/28/2022   GLUCOSE 103 (H) 01/28/2022   CHOL 137 01/28/2022   TRIG 106 01/28/2022   HDL 50 01/28/2022   LDLCALC 68 01/28/2022   ALT 20 01/28/2022   AST 22 01/28/2022   NA 141 01/28/2022   K 5.1 01/28/2022   CL 100 01/28/2022   CREATININE 1.09 (H) 01/28/2022   BUN 14 01/28/2022   CO2 26 01/28/2022   TSH 3.560 03/28/2022      Assessment & Plan:   Problem List Items Addressed This Visit       Cardiovascular and Mediastinum   Benign hypertension - Primary   Relevant Orders   CBC with Differential/Platelet   Comprehensive metabolic panel Continue current meds     Endocrine   Hypothyroidism   Relevant Orders   TSH Continue meds     Other   Hyperlipidemia   Relevant Orders   Lipid panel Continue meds and watch diet   Vitamin D insufficiency   Relevant Orders   VITAMIN D 25 Hydroxy (Vit-D Deficiency, Fractures)   Depression with anxiety Continue current meds   Other Visit Diagnoses     Need for diphtheria-tetanus-pertussis (Tdap) vaccine       Relevant Orders   Tdap vaccine greater than or equal to 7yo IM   Restless leg syndrome       Relevant Medications   rOPINIRole (REQUIP) 2 MG tablet     .  Meds ordered this encounter  Medications   rOPINIRole (REQUIP) 2 MG tablet    Sig: Take 1 tablet (2 mg total) by mouth at bedtime.     Dispense:  90 tablet    Refill:  1    Order Specific Question:   Supervising Provider    Answer04/01/2022    Orders Placed This Encounter  Procedures   Tdap vaccine greater than or equal to 7yo IM   CBC with Differential/Platelet   Comprehensive metabolic panel   TSH   Lipid panel   VITAMIN D 25 Hydroxy (Vit-D Deficiency, Fractures)     Follow-up: Return in about 6  months (around 02/25/2023) for chronic fasting follow up.  An After Visit Summary was printed and given to the patient.  Jettie Pagan [facility] [phone]

## 2022-08-26 ENCOUNTER — Other Ambulatory Visit: Payer: Self-pay | Admitting: Physician Assistant

## 2022-08-26 ENCOUNTER — Other Ambulatory Visit: Payer: Self-pay

## 2022-08-26 DIAGNOSIS — R739 Hyperglycemia, unspecified: Secondary | ICD-10-CM

## 2022-08-26 DIAGNOSIS — E039 Hypothyroidism, unspecified: Secondary | ICD-10-CM

## 2022-08-26 LAB — COMPREHENSIVE METABOLIC PANEL
ALT: 32 IU/L (ref 0–32)
AST: 28 IU/L (ref 0–40)
Albumin/Globulin Ratio: 1.6 (ref 1.2–2.2)
Albumin: 4.5 g/dL (ref 3.8–4.9)
Alkaline Phosphatase: 102 IU/L (ref 44–121)
BUN/Creatinine Ratio: 11 (ref 9–23)
BUN: 12 mg/dL (ref 6–24)
Bilirubin Total: 0.3 mg/dL (ref 0.0–1.2)
CO2: 24 mmol/L (ref 20–29)
Calcium: 10.1 mg/dL (ref 8.7–10.2)
Chloride: 105 mmol/L (ref 96–106)
Creatinine, Ser: 1.1 mg/dL — ABNORMAL HIGH (ref 0.57–1.00)
Globulin, Total: 2.9 g/dL (ref 1.5–4.5)
Glucose: 111 mg/dL — ABNORMAL HIGH (ref 70–99)
Potassium: 5 mmol/L (ref 3.5–5.2)
Sodium: 144 mmol/L (ref 134–144)
Total Protein: 7.4 g/dL (ref 6.0–8.5)
eGFR: 58 mL/min/{1.73_m2} — ABNORMAL LOW (ref >59)

## 2022-08-26 LAB — CBC WITH DIFFERENTIAL/PLATELET
Basophils Absolute: 0.1 10*3/uL (ref 0.0–0.2)
Basos: 1 %
EOS (ABSOLUTE): 0.3 10*3/uL (ref 0.0–0.4)
Eos: 3 %
Hematocrit: 39.8 % (ref 34.0–46.6)
Hemoglobin: 13.4 g/dL (ref 11.1–15.9)
Immature Grans (Abs): 0 10*3/uL (ref 0.0–0.1)
Immature Granulocytes: 0 %
Lymphocytes Absolute: 2.8 10*3/uL (ref 0.7–3.1)
Lymphs: 30 %
MCH: 30.5 pg (ref 26.6–33.0)
MCHC: 33.7 g/dL (ref 31.5–35.7)
MCV: 91 fL (ref 79–97)
Monocytes Absolute: 1 10*3/uL — ABNORMAL HIGH (ref 0.1–0.9)
Monocytes: 11 %
Neutrophils Absolute: 5.2 10*3/uL (ref 1.4–7.0)
Neutrophils: 55 %
Platelets: 303 10*3/uL (ref 150–450)
RBC: 4.4 x10E6/uL (ref 3.77–5.28)
RDW: 12.5 % (ref 11.7–15.4)
WBC: 9.4 10*3/uL (ref 3.4–10.8)

## 2022-08-26 LAB — TSH: TSH: 1.32 u[IU]/mL (ref 0.450–4.500)

## 2022-08-26 LAB — LIPID PANEL
Chol/HDL Ratio: 3.7 ratio (ref 0.0–4.4)
Cholesterol, Total: 161 mg/dL (ref 100–199)
HDL: 43 mg/dL (ref >39)
LDL Chol Calc (NIH): 89 mg/dL (ref 0–99)
Triglycerides: 169 mg/dL — ABNORMAL HIGH (ref 0–149)
VLDL Cholesterol Cal: 29 mg/dL (ref 5–40)

## 2022-08-26 LAB — VITAMIN D 25 HYDROXY (VIT D DEFICIENCY, FRACTURES): Vit D, 25-Hydroxy: 33.1 ng/mL (ref 30.0–100.0)

## 2022-08-26 LAB — CARDIOVASCULAR RISK ASSESSMENT

## 2022-08-26 MED ORDER — LEVOTHYROXINE SODIUM 75 MCG PO TABS: 75.0000 ug | ORAL_TABLET | Freq: Every day | ORAL | 2 refills | Status: DC

## 2022-08-27 LAB — SPECIMEN STATUS REPORT

## 2022-08-27 LAB — HGB A1C W/O EAG: Hgb A1c MFr Bld: 6.9 % — ABNORMAL HIGH (ref 4.8–5.6)

## 2022-10-02 IMAGING — MG MM DIGITAL SCREENING BILAT W/ TOMO AND CAD
6 of 10 series · 6 of 30 positions shown · non-contrast
Comparison: Previous exam(s).

CLINICAL DATA: Screening.

EXAM:
DIGITAL SCREENING BILATERAL MAMMOGRAM WITH TOMOSYNTHESIS AND CAD
TECHNIQUE: Bilateral screening digital craniocaudal and mediolateral oblique
mammograms were obtained. Bilateral screening digital breast
tomosynthesis was performed. The images were evaluated with
computer-aided detection.

[R MLO synth-2D (1 of 2)]
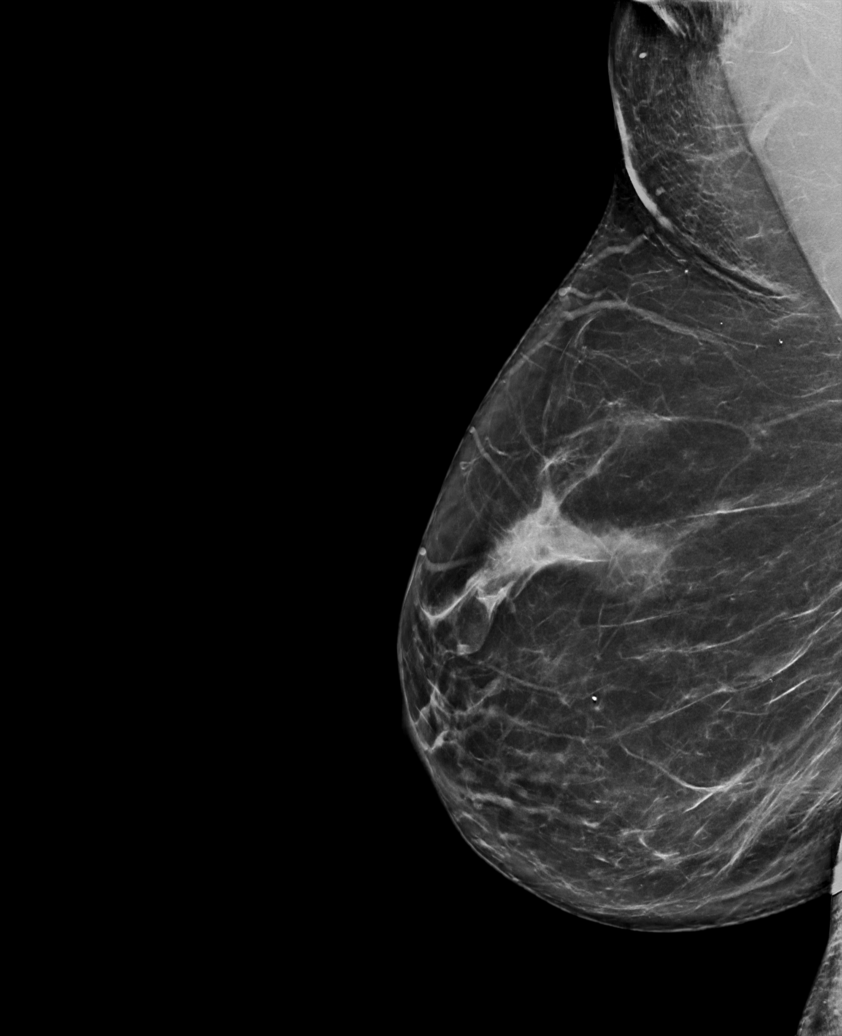

[L MLO synth-2D]
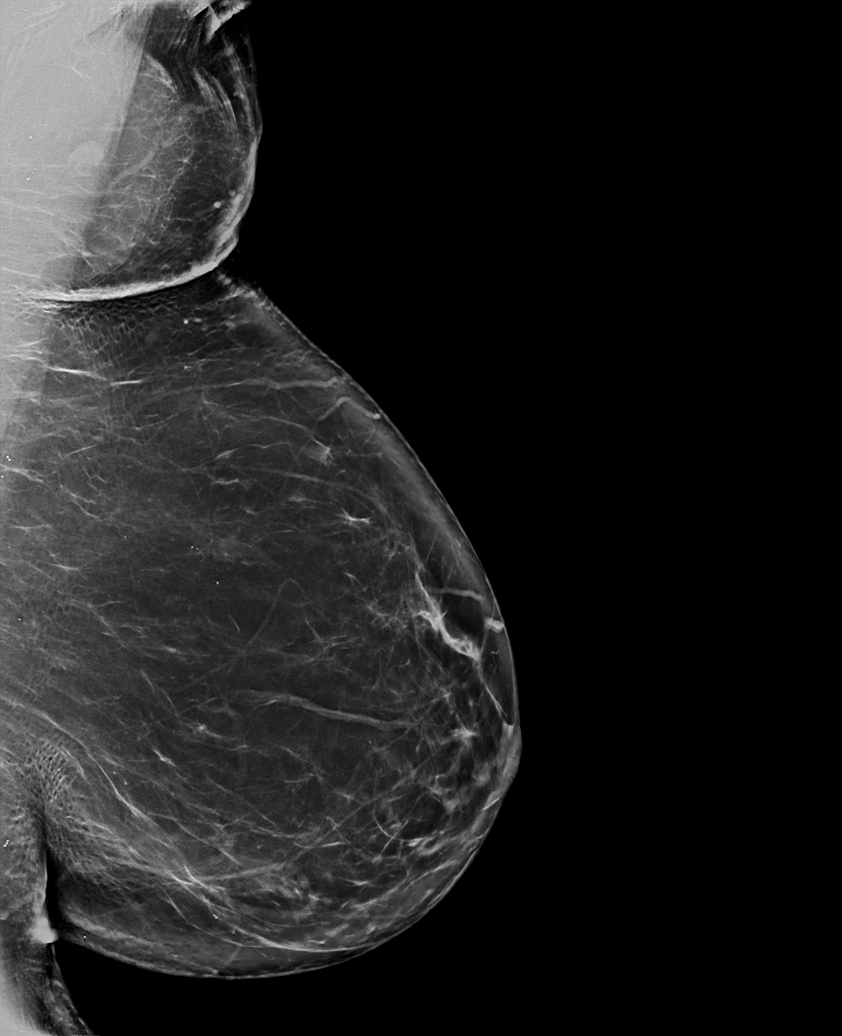

[L CC synth-2D]
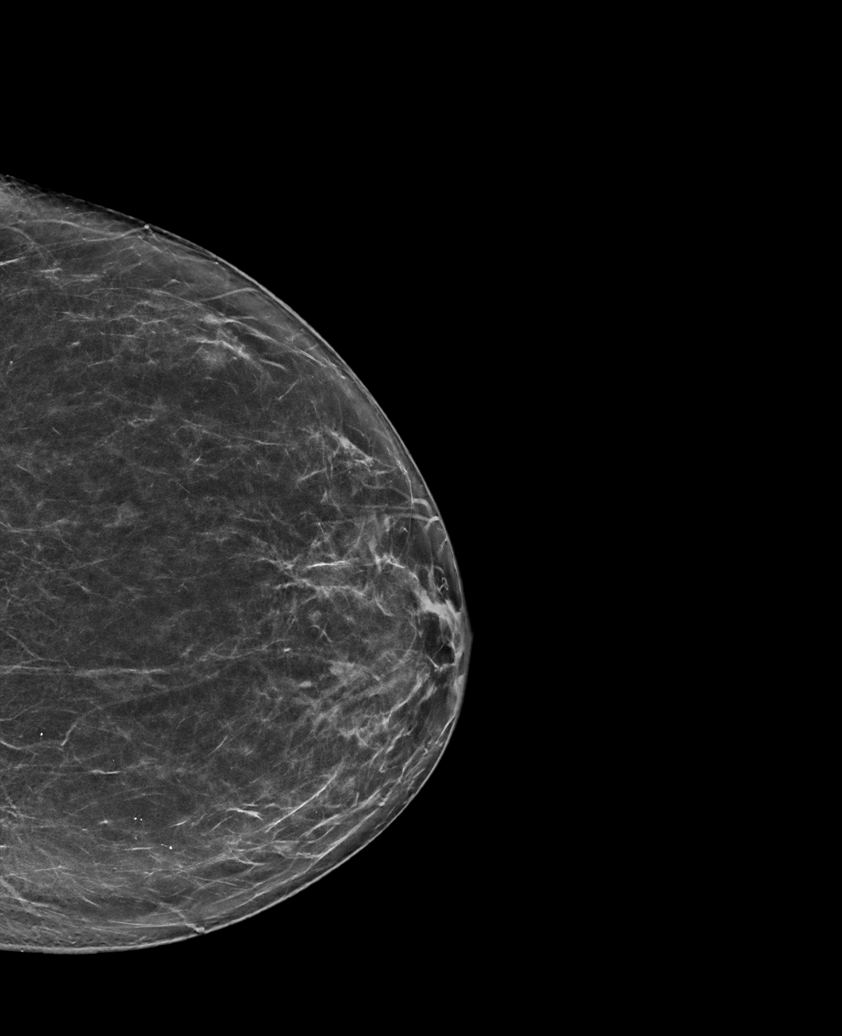

[R CC synth-2D]
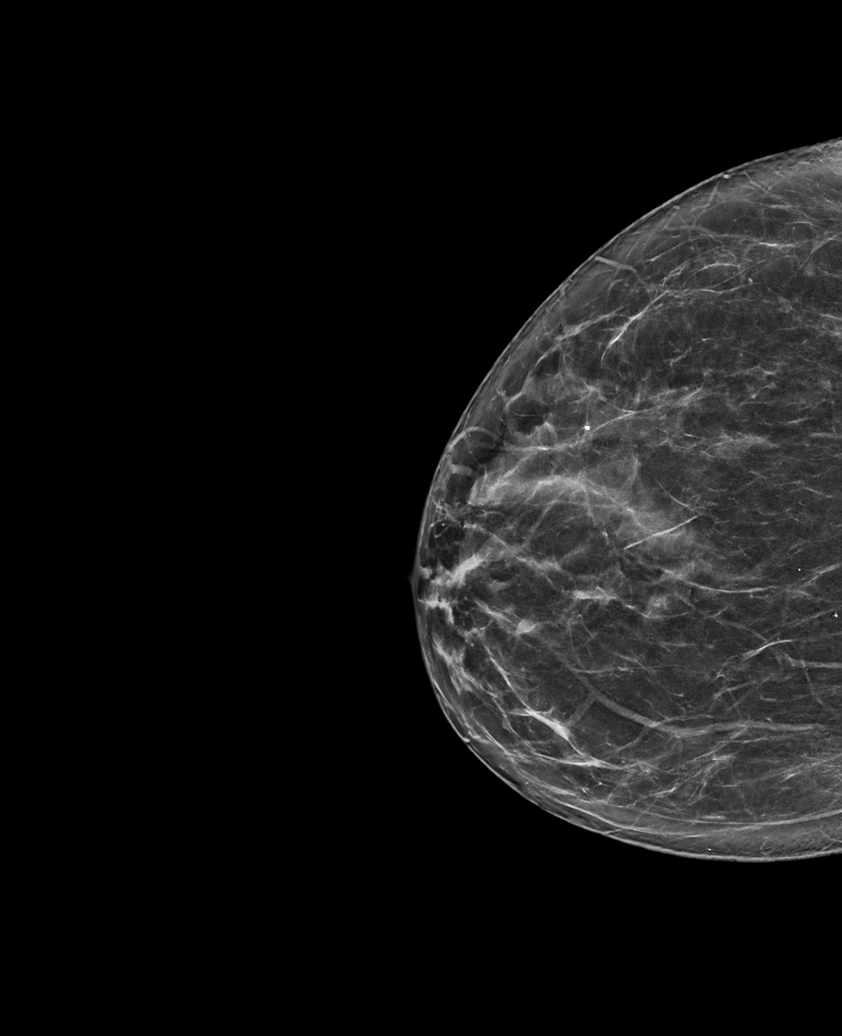

[R MLO synth-2D (2 of 2)]
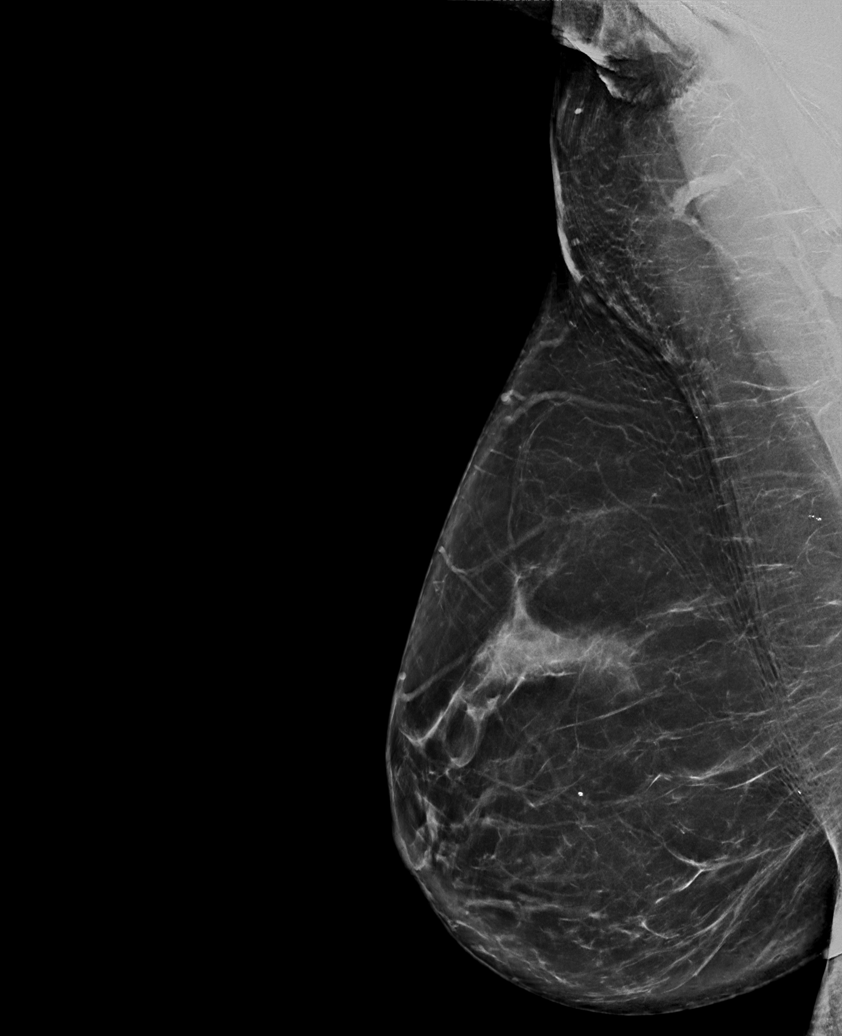

[R MLO tomo · tomo slice 49/96.0]
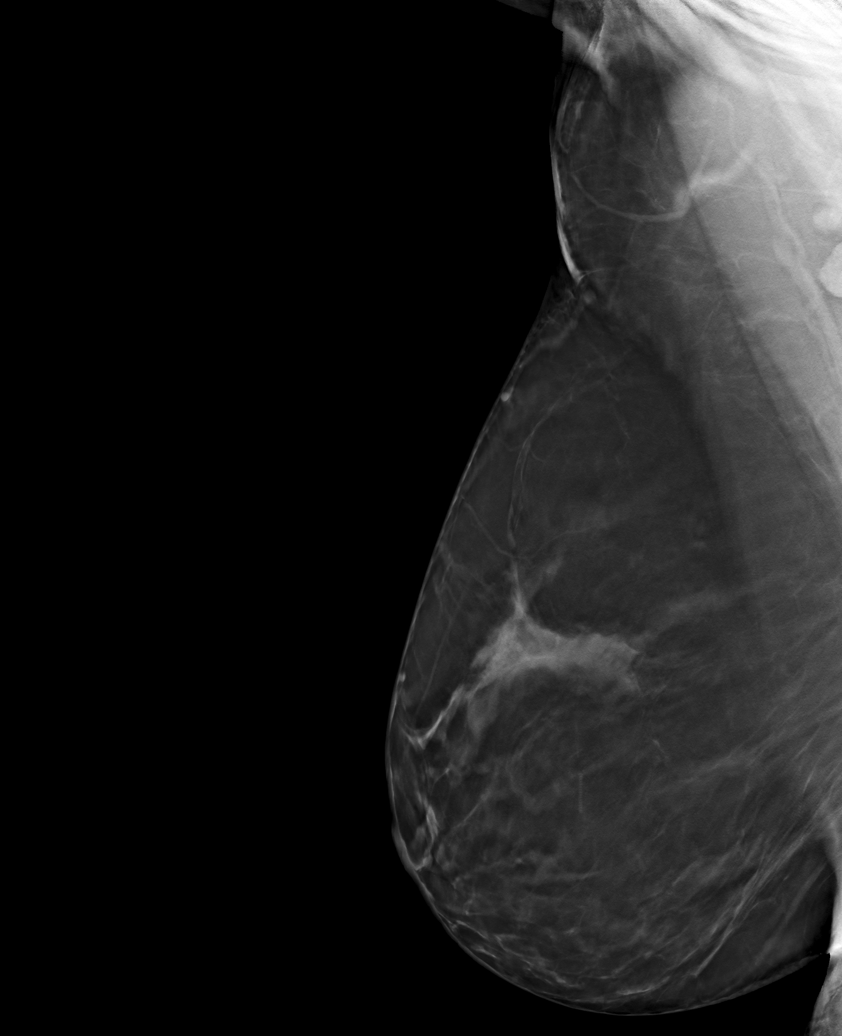

[6 of 30 positions shown; findings below may reference images not displayed]

ACR Breast Density Category b: There are scattered areas of
fibroglandular density.
FINDINGS: There are no findings suspicious for malignancy.
IMPRESSION: No mammographic evidence of malignancy. A result letter of this
screening mammogram will be mailed directly to the patient.

RECOMMENDATION:
Screening mammogram in one year. (Code:51-O-LD2)

BI-RADS CATEGORY  1: Negative.

## 2022-10-22 ENCOUNTER — Other Ambulatory Visit: Payer: Self-pay | Admitting: Physician Assistant

## 2022-10-22 DIAGNOSIS — E039 Hypothyroidism, unspecified: Secondary | ICD-10-CM

## 2022-11-08 ENCOUNTER — Other Ambulatory Visit: Payer: Self-pay | Admitting: Physician Assistant

## 2022-11-08 DIAGNOSIS — F418 Other specified anxiety disorders: Secondary | ICD-10-CM

## 2022-11-22 ENCOUNTER — Other Ambulatory Visit: Payer: Self-pay | Admitting: Physician Assistant

## 2022-11-23 ENCOUNTER — Other Ambulatory Visit: Payer: Self-pay | Admitting: Family Medicine

## 2022-11-25 ENCOUNTER — Other Ambulatory Visit: Payer: Self-pay | Admitting: Physician Assistant

## 2022-11-25 DIAGNOSIS — G2581 Restless legs syndrome: Secondary | ICD-10-CM

## 2022-11-25 MED ORDER — ROPINIROLE HCL 2 MG PO TABS: 2.0000 mg | ORAL_TABLET | Freq: Every day | ORAL | 0 refills | Status: DC

## 2022-12-01 ENCOUNTER — Encounter: Payer: Self-pay | Admitting: Physician Assistant

## 2022-12-01 ENCOUNTER — Ambulatory Visit (INDEPENDENT_AMBULATORY_CARE_PROVIDER_SITE_OTHER): Payer: BC Managed Care – PPO | Admitting: Physician Assistant

## 2022-12-01 VITALS — BP 126/76 | HR 75 | Temp 97.0°F | Ht 64.0 in | Wt 174.0 lb

## 2022-12-01 DIAGNOSIS — E559 Vitamin D deficiency, unspecified: Secondary | ICD-10-CM

## 2022-12-01 DIAGNOSIS — E782 Mixed hyperlipidemia: Secondary | ICD-10-CM

## 2022-12-01 DIAGNOSIS — I10 Essential (primary) hypertension: Secondary | ICD-10-CM | POA: Diagnosis not present

## 2022-12-01 DIAGNOSIS — Z23 Encounter for immunization: Secondary | ICD-10-CM

## 2022-12-01 DIAGNOSIS — E1165 Type 2 diabetes mellitus with hyperglycemia: Secondary | ICD-10-CM | POA: Diagnosis not present

## 2022-12-01 DIAGNOSIS — E038 Other specified hypothyroidism: Secondary | ICD-10-CM

## 2022-12-01 DIAGNOSIS — F418 Other specified anxiety disorders: Secondary | ICD-10-CM

## 2022-12-01 MED ORDER — DULOXETINE HCL 60 MG PO CPEP: 60.0000 mg | ORAL_CAPSULE | Freq: Every day | ORAL | 1 refills | Status: DC

## 2022-12-01 NOTE — Progress Notes (Signed)
Subjective:  Patient ID: Ebony Palmer, female    DOB: 09-Mar-1961  Age: 61 y.o. MRN: [id number]  Chief Complaint  Patient presents with   Hypertension    Hypertension    Pt presents for follow up of hypertension.The patient is tolerating the medication well without side effects. Compliance with treatment has been good; including taking medication as directed , maintains a healthy diet and regular exercise regimen , and following up as directed. She is currently taking micardis 80mg  qd  Mixed hyperlipidemia  Pt presents with hyperlipidemia.  The patient is compliant with medications, maintains a low cholesterol diet , follows up as directed , and maintains an exercise regimen . The patient denies experiencing any hypercholesterolemia related symptoms. She is currently on crestor 10mg  qd  Pt was newly diagnosed with NIDDM at last visit - hgb A1c was 6.9 - she deferred seeing nutritionist at that time - states that she is trying to watch diet Not checking glucose readings  Pt uses requip 2mg  at night for restless leg syndrome - is helping her symptoms  Pt with history of anxiety with depression - symptoms have been stable with current meds of cymbalta 60 and buspar 5-   Pt with history of hypothyroidism - currently on synthroid qd - voices no concerns or problems  Pt with history of Vit D def but has stopped supplement - due to recheck  Pt would like to get flu shot and COVID booster Current Outpatient Medications on File Prior to Visit  Medication Sig Dispense Refill   busPIRone (BUSPAR) 5 MG tablet TAKE 1 TABLET BY MOUTH TWICE A DAY 180 tablet 0   levothyroxine (SYNTHROID) 75 MCG tablet TAKE 1 TABLET BY MOUTH EVERY DAY 90 tablet 0   rOPINIRole (REQUIP) 2 MG tablet Take 1 tablet (2 mg total) by mouth at bedtime. 90 tablet 0   rosuvastatin (CRESTOR) 10 MG tablet TAKE 1 TABLET BY MOUTH EVERY DAY 90 tablet 0   telmisartan (MICARDIS) 80 MG tablet TAKE 1 TABLET BY MOUTH EVERY DAY  90 tablet 0   No current facility-administered medications on file prior to visit.   Past Medical History:  Diagnosis Date   Arthritis    Depression    Hyperlipidemia    Hypertension    Thyroid disease    Past Surgical History:  Procedure Laterality Date   BACK SURGERY     BREAST CYST ASPIRATION Right    2016   CESAREAN SECTION     CHOLECYSTECTOMY     ROTATOR CUFF REPAIR Left     History reviewed. No pertinent family history. Social History   Socioeconomic History   Marital status: Married    Spouse name: Not on file   Number of children: Not on file   Years of education: Not on file   Highest education level: Not on file  Occupational History   Not on file  Tobacco Use   Smoking status: Never   Smokeless tobacco: Never  Vaping Use   Vaping Use: Never used  Substance and Sexual Activity   Alcohol use: Never   Drug use: Never   Sexual activity: Yes  Other Topics Concern   Not on file  Social History Narrative   Not on file   Social Determinants of Health   Financial Resource Strain: Not on file  Food Insecurity: Not on file  Transportation Needs: Not on file  Physical Activity: Not on file  Stress: Not on file  Social Connections: Not  on file   CONSTITUTIONAL: Negative for chills, fatigue, fever, unintentional weight gain and unintentional weight loss.  E/N/T: Negative for ear pain, nasal congestion and sore throat.  CARDIOVASCULAR: Negative for chest pain, dizziness, palpitations and pedal edema.  RESPIRATORY: Negative for recent cough and dyspnea.  GASTROINTESTINAL: Negative for abdominal pain, acid reflux symptoms, constipation, diarrhea, nausea and vomiting.  MSK: Negative for arthralgias and myalgias.  INTEGUMENTARY: Negative for rash.  NEUROLOGICAL: Negative for dizziness and headaches.  PSYCHIATRIC: Negative for sleep disturbance and to question depression screen.  Negative for depression, negative for anhedonia.        Objective:  PHYSICAL  EXAM:   VS: BP 126/76 (BP Location: Left Arm, Patient Position: Sitting, Cuff Size: Large)   Pulse 75   Temp (!) 97 F (36.1 C) (Temporal)   Ht 5\' 4"  (1.626 m)   Wt 174 lb (78.9 kg)   SpO2 100%   BMI 29.87 kg/m   GEN: Well nourished, well developed, in no acute distress  Cardiac: RRR; no murmurs,  Respiratory:  normal respiratory rate and pattern with no distress - normal breath sounds with no rales, rhonchi, wheezes or rubs GI: normal bowel sounds, no masses or tenderness Skin: warm and dry, no rash   Psych: euthymic mood, appropriate affect and demeanor   Lab Results  Component Value Date   WBC 9.4 08/25/2022   HGB 13.4 08/25/2022   HCT 39.8 08/25/2022   PLT 303 08/25/2022   GLUCOSE 111 (H) 08/25/2022   CHOL 161 08/25/2022   TRIG 169 (H) 08/25/2022   HDL 43 08/25/2022   LDLCALC 89 08/25/2022   ALT 32 08/25/2022   AST 28 08/25/2022   NA 144 08/25/2022   K 5.0 08/25/2022   CL 105 08/25/2022   CREATININE 1.10 (H) 08/25/2022   BUN 12 08/25/2022   CO2 24 08/25/2022   TSH 1.320 08/25/2022   HGBA1C 6.9 (H) 08/25/2022      Assessment & Plan:   Problem List Items Addressed This Visit       Cardiovascular and Mediastinum   Benign hypertension - Primary   Relevant Orders   CBC with Differential/Platelet   Comprehensive metabolic panel Continue current meds     Endocrine   Hypothyroidism   Relevant Orders   TSH Continue meds     Other   Hyperlipidemia   Relevant Orders   Lipid panel Continue meds and watch diet   Vitamin D insufficiency   Relevant Orders   VITAMIN D 25 Hydroxy (Vit-D Deficiency, Fractures)   Depression with anxiety Continue current meds    NIDDM Hgb A1c pending Watch diet Other Visit Diagnoses     Restless leg syndrome       Relevant Medications   rOPINIRole (REQUIP) 2 MG tablet  Need flu vaccine Flucelvax given  Need COVID booster Pfizer COVID booster given     .  Meds ordered this encounter  Medications    DULoxetine (CYMBALTA) 60 MG capsule    Sig: Take 1 capsule (60 mg total) by mouth daily.    Dispense:  90 capsule    Refill:  1    Order Specific Question:   Supervising Provider    Answer08/30/2023    Orders Placed This Encounter  Procedures   Flu Vaccine MDCK QUAD PF   [employer]izer Fall 2023 Covid-19 Vaccine 81yrs and older   CBC with Differential/Platelet   Comprehensive metabolic panel   TSH   Lipid panel   Hemoglobin  A1c   Microalbumin / creatinine urine ratio     Follow-up: Return in about 4 months (around 04/02/2023) for chronic fasting follow up.  An After Visit Summary was printed and given to the patient.  Jettie Pagan [facility] [phone]

## 2022-12-02 LAB — CBC WITH DIFFERENTIAL/PLATELET
Basophils Absolute: 0.1 10*3/uL (ref 0.0–0.2)
Basos: 1 %
EOS (ABSOLUTE): 0.2 10*3/uL (ref 0.0–0.4)
Eos: 2 %
Hematocrit: 41.4 % (ref 34.0–46.6)
Hemoglobin: 14.1 g/dL (ref 11.1–15.9)
Immature Grans (Abs): 0 10*3/uL (ref 0.0–0.1)
Immature Granulocytes: 0 %
Lymphocytes Absolute: 2.5 10*3/uL (ref 0.7–3.1)
Lymphs: 25 %
MCH: 30.9 pg (ref 26.6–33.0)
MCHC: 34.1 g/dL (ref 31.5–35.7)
MCV: 91 fL (ref 79–97)
Monocytes Absolute: 1 10*3/uL — ABNORMAL HIGH (ref 0.1–0.9)
Monocytes: 10 %
Neutrophils Absolute: 6 10*3/uL (ref 1.4–7.0)
Neutrophils: 62 %
Platelets: 283 10*3/uL (ref 150–450)
RBC: 4.56 x10E6/uL (ref 3.77–5.28)
RDW: 12.1 % (ref 11.7–15.4)
WBC: 9.8 10*3/uL (ref 3.4–10.8)

## 2022-12-02 LAB — COMPREHENSIVE METABOLIC PANEL
ALT: 18 IU/L (ref 0–32)
AST: 24 IU/L (ref 0–40)
Albumin/Globulin Ratio: 1.6 (ref 1.2–2.2)
Albumin: 4.6 g/dL (ref 3.8–4.9)
Alkaline Phosphatase: 99 IU/L (ref 44–121)
BUN/Creatinine Ratio: 11 (ref 9–23)
BUN: 13 mg/dL (ref 6–24)
Bilirubin Total: 0.4 mg/dL (ref 0.0–1.2)
CO2: 24 mmol/L (ref 20–29)
Calcium: 10.4 mg/dL — ABNORMAL HIGH (ref 8.7–10.2)
Chloride: 102 mmol/L (ref 96–106)
Creatinine, Ser: 1.17 mg/dL — ABNORMAL HIGH (ref 0.57–1.00)
Globulin, Total: 2.8 g/dL (ref 1.5–4.5)
Glucose: 103 mg/dL — ABNORMAL HIGH (ref 70–99)
Potassium: 4.9 mmol/L (ref 3.5–5.2)
Sodium: 142 mmol/L (ref 134–144)
Total Protein: 7.4 g/dL (ref 6.0–8.5)
eGFR: 54 mL/min/{1.73_m2} — ABNORMAL LOW (ref >59)

## 2022-12-02 LAB — HEMOGLOBIN A1C
Est. average glucose Bld gHb Est-mCnc: 146 mg/dL
Hgb A1c MFr Bld: 6.7 % — ABNORMAL HIGH (ref 4.8–5.6)

## 2022-12-02 LAB — LIPID PANEL
Chol/HDL Ratio: 3.5 ratio (ref 0.0–4.4)
Cholesterol, Total: 171 mg/dL (ref 100–199)
HDL: 49 mg/dL (ref >39)
LDL Chol Calc (NIH): 101 mg/dL — ABNORMAL HIGH (ref 0–99)
Triglycerides: 119 mg/dL (ref 0–149)
VLDL Cholesterol Cal: 21 mg/dL (ref 5–40)

## 2022-12-02 LAB — CARDIOVASCULAR RISK ASSESSMENT

## 2022-12-02 LAB — MICROALBUMIN / CREATININE URINE RATIO
Creatinine, Urine: 23.2 mg/dL
Microalb/Creat Ratio: <13 mg/g creat (ref 0–29)
Microalbumin, Urine: <3 ug/mL

## 2022-12-02 LAB — TSH: TSH: 1.67 u[IU]/mL (ref 0.450–4.500)

## 2023-01-21 ENCOUNTER — Other Ambulatory Visit: Payer: Self-pay | Admitting: Physician Assistant

## 2023-01-21 DIAGNOSIS — E039 Hypothyroidism, unspecified: Secondary | ICD-10-CM

## 2023-02-26 ENCOUNTER — Other Ambulatory Visit: Payer: Self-pay | Admitting: Family Medicine

## 2023-02-27 ENCOUNTER — Other Ambulatory Visit: Payer: Self-pay | Admitting: Physician Assistant

## 2023-03-03 ENCOUNTER — Ambulatory Visit: Payer: BC Managed Care – PPO | Admitting: Physician Assistant

## 2023-04-02 ENCOUNTER — Ambulatory Visit: Payer: BC Managed Care – PPO | Admitting: Physician Assistant

## 2023-04-21 ENCOUNTER — Other Ambulatory Visit: Payer: Self-pay | Admitting: Physician Assistant

## 2023-04-21 DIAGNOSIS — E039 Hypothyroidism, unspecified: Secondary | ICD-10-CM

## 2023-05-05 ENCOUNTER — Other Ambulatory Visit: Payer: Self-pay | Admitting: Physician Assistant

## 2023-05-05 DIAGNOSIS — E039 Hypothyroidism, unspecified: Secondary | ICD-10-CM

## 2023-05-05 NOTE — Telephone Encounter (Signed)
Pt overdue for fasting follow up --- please schedule in the next 4-6 weeks

## 2023-05-18 ENCOUNTER — Other Ambulatory Visit: Payer: Self-pay | Admitting: Physician Assistant

## 2023-05-18 DIAGNOSIS — G2581 Restless legs syndrome: Secondary | ICD-10-CM

## 2023-05-29 ENCOUNTER — Other Ambulatory Visit: Payer: Self-pay | Admitting: Family Medicine

## 2023-05-30 ENCOUNTER — Other Ambulatory Visit: Payer: Self-pay | Admitting: Physician Assistant

## 2023-06-02 ENCOUNTER — Encounter: Payer: Self-pay | Admitting: Physician Assistant

## 2023-06-02 ENCOUNTER — Ambulatory Visit: Payer: Self-pay | Admitting: Physician Assistant

## 2023-06-10 ENCOUNTER — Other Ambulatory Visit: Payer: Self-pay | Admitting: Physician Assistant

## 2023-06-10 DIAGNOSIS — F418 Other specified anxiety disorders: Secondary | ICD-10-CM

## 2023-06-11 ENCOUNTER — Encounter: Payer: Self-pay | Admitting: Physician Assistant

## 2023-06-11 ENCOUNTER — Ambulatory Visit (INDEPENDENT_AMBULATORY_CARE_PROVIDER_SITE_OTHER): Payer: 59 | Admitting: Physician Assistant

## 2023-06-11 VITALS — BP 120/72 | HR 86 | Temp 97.2°F | Ht 64.0 in | Wt 173.8 lb

## 2023-06-11 DIAGNOSIS — E782 Mixed hyperlipidemia: Secondary | ICD-10-CM | POA: Diagnosis not present

## 2023-06-11 DIAGNOSIS — I10 Essential (primary) hypertension: Secondary | ICD-10-CM | POA: Diagnosis not present

## 2023-06-11 DIAGNOSIS — E038 Other specified hypothyroidism: Secondary | ICD-10-CM

## 2023-06-11 DIAGNOSIS — R5383 Other fatigue: Secondary | ICD-10-CM | POA: Diagnosis not present

## 2023-06-11 DIAGNOSIS — E1165 Type 2 diabetes mellitus with hyperglycemia: Secondary | ICD-10-CM

## 2023-06-11 DIAGNOSIS — E559 Vitamin D deficiency, unspecified: Secondary | ICD-10-CM

## 2023-06-11 DIAGNOSIS — Z1231 Encounter for screening mammogram for malignant neoplasm of breast: Secondary | ICD-10-CM

## 2023-06-11 DIAGNOSIS — F418 Other specified anxiety disorders: Secondary | ICD-10-CM

## 2023-06-11 DIAGNOSIS — E119 Type 2 diabetes mellitus without complications: Secondary | ICD-10-CM

## 2023-06-11 NOTE — Progress Notes (Signed)
Subjective:  Patient ID: Ebony Palmer, female    DOB: [birth year]  Age: 62 y.o. MRN: [id number]  Chief Complaint  Patient presents with   Fatigue    Hypertension    Pt presents for follow up of hypertension.The patient is tolerating the medication well without side effects. Compliance with treatment has been good; including taking medication as directed , maintains a healthy diet and regular exercise regimen , and following up as directed. She is currently taking micardis 80mg  qd  Mixed hyperlipidemia  Pt presents with hyperlipidemia.  The patient is compliant with medications, maintains a low cholesterol diet , follows up as directed , and maintains an exercise regimen . The patient denies experiencing any hypercholesterolemia related symptoms. She is currently on crestor 10mg  qd  Pt was newly diagnosed with NIDDM at last visit - hgb A1c was 6.7- she deferred seeing nutritionist at that time - states that she is trying to watch diet Not checking glucose readings  Pt uses requip 2mg  at night for restless leg syndrome - is helping her symptoms  Pt with history of anxiety with depression - symptoms have been stable with current meds of cymbalta 60 and buspar 5-   Pt with history of hypothyroidism - currently on synthroid qd - voices no concerns or problems  Pt with history of Vit D def but has stopped supplement - due to recheck  Pt has complained of moderate fatigue for the past several weeks - denies chest pain or dyspnea  Pt is due for mammogram - would like to schedule at [facility]  Pt will be having right rotator cuff surgery in the near future - following with Dr Deberah Castle Current Outpatient Medications on File Prior to Visit  Medication Sig Dispense Refill   busPIRone (BUSPAR) 5 MG tablet TAKE 1 TABLET BY MOUTH TWICE A DAY 180 tablet 0   DULoxetine (CYMBALTA) 60 MG capsule Take 1 capsule (60 mg total) by mouth daily. 90 capsule 1   levothyroxine (SYNTHROID) 75 MCG tablet  TAKE 1 TABLET BY MOUTH EVERY DAY 90 tablet 0   rOPINIRole (REQUIP) 2 MG tablet TAKE 1 TABLET BY MOUTH AT BEDTIME. 90 tablet 0   rosuvastatin (CRESTOR) 10 MG tablet TAKE 1 TABLET BY MOUTH EVERY DAY 90 tablet 0   telmisartan (MICARDIS) 80 MG tablet TAKE 1 TABLET BY MOUTH EVERY DAY 90 tablet 0   No current facility-administered medications on file prior to visit.   Past Medical History:  Diagnosis Date   Arthritis    Depression    Hyperlipidemia    Hypertension    Thyroid disease    Past Surgical History:  Procedure Laterality Date   BACK SURGERY     BREAST CYST ASPIRATION Right    2016   CESAREAN SECTION     CHOLECYSTECTOMY     ROTATOR CUFF REPAIR Left     History reviewed. No pertinent family history. Social History   Socioeconomic History   Marital status: Married    Spouse name: Not on file   Number of children: Not on file   Years of education: Not on file   Highest education level: Not on file  Occupational History   Not on file  Tobacco Use   Smoking status: Never   Smokeless tobacco: Never  Vaping Use   Vaping Use: Never used  Substance and Sexual Activity   Alcohol use: Never   Drug use: Never   Sexual activity: Yes  Other Topics Concern  Not on file  Social History Narrative   Not on file   Social Determinants of Health   Financial Resource Strain: Not on file  Food Insecurity: Not on file  Transportation Needs: Not on file  Physical Activity: Not on file  Stress: Not on file  Social Connections: Not on file   CONSTITUTIONAL: see HPI E/N/T: Negative for ear pain, nasal congestion and sore throat.  CARDIOVASCULAR: Negative for chest pain, dizziness, palpitations and pedal edema.  RESPIRATORY: Negative for recent cough and dyspnea.  GASTROINTESTINAL: Negative for abdominal pain, acid reflux symptoms, constipation, diarrhea, nausea and vomiting.  MSK: Negative for arthralgias and myalgias.  INTEGUMENTARY: Negative for rash.  NEUROLOGICAL: Negative  for dizziness and headaches.  PSYCHIATRIC: Negative for sleep disturbance and to question depression screen.  Negative for depression, negative for anhedonia.       Objective:  PHYSICAL EXAM:   VS: BP 120/72 (BP Location: Left Arm, Patient Position: Sitting, Cuff Size: Normal)   Pulse 86   Temp (!) 97.2 F (36.2 C) (Temporal)   Ht 5\' 4"  (1.626 m)   Wt 173 lb 12.8 oz (78.8 kg)   SpO2 94%   BMI 29.83 kg/m   GEN: Well nourished, well developed, in no acute distress  Cardiac: RRR; no murmurs, rubs, or gallops,no edema - Respiratory:  normal respiratory rate and pattern with no distress - normal breath sounds with no rales, rhonchi, wheezes or rubs MS: no deformity or atrophy  Skin: warm and dry, no rash  Neuro:  Alert and Oriented x 3, Strength and sensation are intact - CN II-Xii grossly intact Psych: euthymic mood, appropriate affect and demeanor  EKG - normal Lab Results  Component Value Date   WBC 9.8 12/01/2022   HGB 14.1 12/01/2022   HCT 41.4 12/01/2022   PLT 283 12/01/2022   GLUCOSE 103 (H) 12/01/2022   CHOL 171 12/01/2022   TRIG 119 12/01/2022   HDL 49 12/01/2022   LDLCALC 101 (H) 12/01/2022   ALT 18 12/01/2022   AST 24 12/01/2022   NA 142 12/01/2022   K 4.9 12/01/2022   CL 102 12/01/2022   CREATININE 1.17 (H) 12/01/2022   BUN 13 12/01/2022   CO2 24 12/01/2022   TSH 1.670 12/01/2022   HGBA1C 6.7 (H) 12/01/2022      Assessment & Plan:   Problem List Items Addressed This Visit       Cardiovascular and Mediastinum   Benign hypertension - Primary   Relevant Orders   CBC with Differential/Platelet   Comprehensive metabolic panel Continue current meds     Endocrine   Hypothyroidism   Relevant Orders   TSH Continue meds     Other   Hyperlipidemia   Relevant Orders   Lipid panel Continue meds and watch diet   Vitamin D insufficiency   Relevant Orders   VITAMIN D 25 Hydroxy (Vit-D Deficiency, Fractures)   Depression with anxiety Continue  current meds    NIDDM Hgb A1c pending Watch diet Other Visit Diagnoses     Restless leg syndrome       Relevant Medications   rOPINIRole (REQUIP) 2 MG tablet  Fatigue Labwork pending  Screening for breast cancer Mammogram ordered     .  No orders of the defined types were placed in this encounter.   Orders Placed This Encounter  Procedures   MM DIGITAL SCREENING BILATERAL   CBC with Differential/Platelet   Comprehensive metabolic panel   TSH   Lipid panel  Hemoglobin A1c   VITAMIN D 25 Hydroxy (Vit-D Deficiency, Fractures)   Iron, TIBC and Ferritin Panel     Follow-up: Return in about 6 months (around 12/11/2023) for chronic fasting follow-up.  An After Visit Summary was printed and given to the patient.  Jettie Pagan [facility] [phone]

## 2023-06-12 ENCOUNTER — Other Ambulatory Visit: Payer: Self-pay | Admitting: Physician Assistant

## 2023-06-12 DIAGNOSIS — E559 Vitamin D deficiency, unspecified: Secondary | ICD-10-CM

## 2023-06-12 LAB — CBC WITH DIFFERENTIAL/PLATELET
Basophils Absolute: 0.1 10*3/uL (ref 0.0–0.2)
Basos: 1 %
EOS (ABSOLUTE): 0.2 10*3/uL (ref 0.0–0.4)
Eos: 2 %
Hematocrit: 39.4 % (ref 34.0–46.6)
Hemoglobin: 13.1 g/dL (ref 11.1–15.9)
Immature Grans (Abs): 0 10*3/uL (ref 0.0–0.1)
Immature Granulocytes: 0 %
Lymphocytes Absolute: 2.7 10*3/uL (ref 0.7–3.1)
Lymphs: 28 %
MCH: 30.3 pg (ref 26.6–33.0)
MCHC: 33.2 g/dL (ref 31.5–35.7)
MCV: 91 fL (ref 79–97)
Monocytes Absolute: 0.9 10*3/uL (ref 0.1–0.9)
Monocytes: 10 %
Neutrophils Absolute: 5.8 10*3/uL (ref 1.4–7.0)
Neutrophils: 59 %
Platelets: 289 10*3/uL (ref 150–450)
RBC: 4.33 x10E6/uL (ref 3.77–5.28)
RDW: 12.4 % (ref 11.7–15.4)
WBC: 9.6 10*3/uL (ref 3.4–10.8)

## 2023-06-12 LAB — IRON,TIBC AND FERRITIN PANEL
Ferritin: 105 ng/mL (ref 15–150)
Iron Saturation: 21 % (ref 15–55)
Iron: 57 ug/dL (ref 27–159)
Total Iron Binding Capacity: 275 ug/dL (ref 250–450)
UIBC: 218 ug/dL (ref 131–425)

## 2023-06-12 LAB — LITHOLINK CKD PROGRAM

## 2023-06-12 LAB — LIPID PANEL
Chol/HDL Ratio: 3 ratio (ref 0.0–4.4)
Cholesterol, Total: 133 mg/dL (ref 100–199)
HDL: 45 mg/dL (ref >39)
LDL Chol Calc (NIH): 61 mg/dL (ref 0–99)
Triglycerides: 161 mg/dL — ABNORMAL HIGH (ref 0–149)
VLDL Cholesterol Cal: 27 mg/dL (ref 5–40)

## 2023-06-12 LAB — COMPREHENSIVE METABOLIC PANEL
ALT: 16 IU/L (ref 0–32)
AST: 20 IU/L (ref 0–40)
Albumin/Globulin Ratio: 1.9
Albumin: 4.4 g/dL (ref 3.8–4.9)
Alkaline Phosphatase: 95 IU/L (ref 44–121)
BUN/Creatinine Ratio: 8 — ABNORMAL LOW (ref 9–23)
BUN: 9 mg/dL (ref 6–24)
Bilirubin Total: 0.3 mg/dL (ref 0.0–1.2)
CO2: 23 mmol/L (ref 20–29)
Calcium: 9.5 mg/dL (ref 8.7–10.2)
Chloride: 103 mmol/L (ref 96–106)
Creatinine, Ser: 1.1 mg/dL — ABNORMAL HIGH (ref 0.57–1.00)
Globulin, Total: 2.3 g/dL (ref 1.5–4.5)
Glucose: 134 mg/dL — ABNORMAL HIGH (ref 70–99)
Potassium: 4.6 mmol/L (ref 3.5–5.2)
Sodium: 140 mmol/L (ref 134–144)
Total Protein: 6.7 g/dL (ref 6.0–8.5)
eGFR: 58 mL/min/{1.73_m2} — ABNORMAL LOW (ref >59)

## 2023-06-12 LAB — HEMOGLOBIN A1C
Est. average glucose Bld gHb Est-mCnc: 148 mg/dL
Hgb A1c MFr Bld: 6.8 % — ABNORMAL HIGH (ref 4.8–5.6)

## 2023-06-12 LAB — VITAMIN D 25 HYDROXY (VIT D DEFICIENCY, FRACTURES): Vit D, 25-Hydroxy: 28.3 ng/mL — ABNORMAL LOW (ref 30.0–100.0)

## 2023-06-12 LAB — TSH: TSH: 2.88 u[IU]/mL (ref 0.450–4.500)

## 2023-06-12 MED ORDER — VITAMIN D (ERGOCALCIFEROL) 1.25 MG (50000 UNIT) PO CAPS
50000.0000 [IU] | ORAL_CAPSULE | ORAL | 5 refills | Status: DC
Start: 2023-06-12 — End: 2024-04-21

## 2023-06-15 ENCOUNTER — Telehealth: Payer: Self-pay | Admitting: Physician Assistant

## 2023-06-15 NOTE — Telephone Encounter (Signed)
PATIENT WILL CALL AFTER SHOULDER SURGERY TO SCHEDULE MAMMOGRAM ON THE MOBILE MAMMOGRAM BUS

## 2023-07-30 HISTORY — PX: ROTATOR CUFF REPAIR: SHX139

## 2023-08-24 ENCOUNTER — Other Ambulatory Visit: Payer: Self-pay | Admitting: Physician Assistant

## 2023-08-24 DIAGNOSIS — G2581 Restless legs syndrome: Secondary | ICD-10-CM

## 2023-08-30 HISTORY — PX: CARPAL TUNNEL RELEASE: SHX101

## 2023-09-01 ENCOUNTER — Other Ambulatory Visit: Payer: Self-pay | Admitting: Physician Assistant

## 2023-09-23 ENCOUNTER — Other Ambulatory Visit: Payer: Self-pay | Admitting: Physician Assistant

## 2023-09-23 DIAGNOSIS — E039 Hypothyroidism, unspecified: Secondary | ICD-10-CM

## 2023-09-29 HISTORY — PX: CARPAL TUNNEL RELEASE: SHX101

## 2023-11-28 ENCOUNTER — Other Ambulatory Visit: Payer: Self-pay | Admitting: Physician Assistant

## 2023-11-28 DIAGNOSIS — G2581 Restless legs syndrome: Secondary | ICD-10-CM

## 2023-11-29 ENCOUNTER — Other Ambulatory Visit: Payer: Self-pay | Admitting: Physician Assistant

## 2023-12-15 ENCOUNTER — Encounter: Payer: Self-pay | Admitting: Physician Assistant

## 2023-12-15 ENCOUNTER — Ambulatory Visit (INDEPENDENT_AMBULATORY_CARE_PROVIDER_SITE_OTHER): Payer: 59 | Admitting: Physician Assistant

## 2023-12-15 VITALS — BP 110/64 | HR 72 | Temp 97.2°F | Resp 12 | Ht 64.0 in | Wt 171.0 lb

## 2023-12-15 DIAGNOSIS — E1159 Type 2 diabetes mellitus with other circulatory complications: Secondary | ICD-10-CM | POA: Diagnosis not present

## 2023-12-15 DIAGNOSIS — E559 Vitamin D deficiency, unspecified: Secondary | ICD-10-CM

## 2023-12-15 DIAGNOSIS — Z23 Encounter for immunization: Secondary | ICD-10-CM | POA: Diagnosis not present

## 2023-12-15 DIAGNOSIS — E038 Other specified hypothyroidism: Secondary | ICD-10-CM | POA: Diagnosis not present

## 2023-12-15 DIAGNOSIS — E1169 Type 2 diabetes mellitus with other specified complication: Secondary | ICD-10-CM | POA: Diagnosis not present

## 2023-12-15 DIAGNOSIS — E785 Hyperlipidemia, unspecified: Secondary | ICD-10-CM

## 2023-12-15 DIAGNOSIS — F418 Other specified anxiety disorders: Secondary | ICD-10-CM

## 2023-12-15 DIAGNOSIS — I152 Hypertension secondary to endocrine disorders: Secondary | ICD-10-CM

## 2023-12-15 DIAGNOSIS — Z1231 Encounter for screening mammogram for malignant neoplasm of breast: Secondary | ICD-10-CM | POA: Insufficient documentation

## 2023-12-15 DIAGNOSIS — E1165 Type 2 diabetes mellitus with hyperglycemia: Secondary | ICD-10-CM

## 2023-12-15 NOTE — Progress Notes (Signed)
Subjective:  Patient ID: Ebony Palmer, female    DOB: Oct 12, 1961  Age: 62 y.o. MRN: [id number]  Chief Complaint  Patient presents with   Medical Management of Chronic Issues    Hypertension    Pt presents for follow up of hypertension.The patient is tolerating the medication well without side effects. Compliance with treatment has been good; including taking medication as directed , maintains a healthy diet and regular exercise regimen , and following up as directed. She is currently taking micardis 80mg  qd  Mixed hyperlipidemia  Pt presents with hyperlipidemia.  The patient is compliant with medications, maintains a low cholesterol diet , follows up as directed , and maintains an exercise regimen . The patient denies experiencing any hypercholesterolemia related symptoms. She is currently on crestor 10mg  qd  Pt was newly diagnosed with NIDDM at last visit - hgb A1c was 6.8- she has deferred seeing nutritionist - states that she is trying to watch diet Not checking glucose readings  Pt uses requip 2mg  at night for restless leg syndrome - is helping her symptoms  Pt with history of anxiety with depression - symptoms have been stable with current meds of cymbalta 60   Pt with history of hypothyroidism - currently on synthroid qd - voices no concerns or problems  Pt with history of Vit D and is taking supplement - due for labwork  Pt is due for mammogram - would like to schedule on bus  Current Outpatient Medications on File Prior to Visit  Medication Sig Dispense Refill   DULoxetine (CYMBALTA) 60 MG capsule TAKE 1 CAPSULE BY MOUTH EVERY DAY 90 capsule 1   levothyroxine (SYNTHROID) 75 MCG tablet TAKE 1 TABLET BY MOUTH EVERY DAY 90 tablet 0   rOPINIRole (REQUIP) 2 MG tablet TAKE 1 TABLET BY MOUTH EVERYDAY AT BEDTIME 90 tablet 0   rosuvastatin (CRESTOR) 10 MG tablet TAKE 1 TABLET BY MOUTH EVERY DAY 90 tablet 0   telmisartan (MICARDIS) 80 MG tablet TAKE 1 TABLET BY MOUTH EVERY  DAY 90 tablet 0   Vitamin D, Ergocalciferol, (DRISDOL) 1.25 MG (50000 UNIT) CAPS capsule Take 1 capsule (50,000 Units total) by mouth every 7 (seven) days. 5 capsule 5   ketorolac (ACULAR) 0.5 % ophthalmic solution Place 1 drop into the left eye 4 (four) times daily. (Patient not taking: Reported on 12/15/2023)     Moxifloxacin HCl 0.5 % SOLN Place 1 drop into the left eye 4 (four) times daily. (Patient not taking: Reported on 12/15/2023)     prednisoLONE acetate (PRED FORTE) 1 % ophthalmic suspension Place 1 drop into the left eye 4 (four) times daily. (Patient not taking: Reported on 12/15/2023)     No current facility-administered medications on file prior to visit.   Past Medical History:  Diagnosis Date   Arthritis    Depression    Hyperlipidemia    Hypertension    Thyroid disease    Past Surgical History:  Procedure Laterality Date   BACK SURGERY     BREAST CYST ASPIRATION Right    2016   CARPAL TUNNEL RELEASE Right 08/2023   CARPAL TUNNEL RELEASE Left 09/2023   CESAREAN SECTION     CHOLECYSTECTOMY     ROTATOR CUFF REPAIR Left 07/2023    History reviewed. No pertinent family history. Social History   Socioeconomic History   Marital status: Married    Spouse name: Not on file   Number of children: Not on file   Years of education:  Not on file   Highest education level: 12th grade  Occupational History   Not on file  Tobacco Use   Smoking status: Never   Smokeless tobacco: Never  Vaping Use   Vaping status: Never Used  Substance and Sexual Activity   Alcohol use: Not Currently   Drug use: Never   Sexual activity: Yes    Partners: Male    Birth control/protection: Post-menopausal  Other Topics Concern   Not on file  Social History Narrative   Not on file   Social Drivers of Health   Financial Resource Strain: Medium Risk (12/11/2023)   Overall Financial Resource Strain (CARDIA)    Difficulty of Paying Living Expenses: Somewhat hard  Food Insecurity: No  Food Insecurity (12/11/2023)   Hunger Vital Sign    Worried About Running Out of Food in the Last Year: Never true    Ran Out of Food in the Last Year: Never true  Transportation Needs: No Transportation Needs (12/11/2023)   PRAPARE - Administrator, Civil Service (Medical): No    Lack of Transportation (Non-Medical): No  Physical Activity: Unknown (12/11/2023)   Exercise Vital Sign    Days of Exercise per Week: 0 days    Minutes of Exercise per Session: Not on file  Stress: Stress Concern Present (12/11/2023)   [employer] of Occupational Health - Occupational Stress Questionnaire    Feeling of Stress : Very much  Social Connections: Moderately Isolated (12/11/2023)   Social Connection and Isolation Panel [NHANES]    Frequency of Communication with Friends and Family: More than three times a week    Frequency of Social Gatherings with Friends and Family: Patient declined    Attends Religious Services: Never    Database administrator or Organizations: No    Attends Engineer, structural: Not on file    Marital Status: Married   CONSTITUTIONAL: Negative for chills, fatigue, fever, unintentional weight gain and unintentional weight loss.  E/N/T: Negative for ear pain, nasal congestion and sore throat.  CARDIOVASCULAR: Negative for chest pain, dizziness, palpitations and pedal edema.  RESPIRATORY: Negative for recent cough and dyspnea.  GASTROINTESTINAL: Negative for abdominal pain, acid reflux symptoms, constipation, diarrhea, nausea and vomiting.  MSK: Negative for arthralgias and myalgias.  INTEGUMENTARY: Negative for rash.  NEUROLOGICAL: Negative for dizziness and headaches.  PSYCHIATRIC: Negative for sleep disturbance and to question depression screen.  Negative for depression, negative for anhedonia.       Objective:  PHYSICAL EXAM:   VS: BP 110/64 (Cuff Size: Normal)   Pulse 72   Temp (!) 97.2 F (36.2 C)   Resp 12   Ht 5\' 4"  (1.626 m)   Wt  171 lb (77.6 kg)   LMP  (LMP Unknown)   SpO2 98%   BMI 29.35 kg/m   GEN: Well nourished, well developed, in no acute distress   Cardiac: RRR; no murmurs, rubs, or gallops,no edema -  Respiratory:  normal respiratory rate and pattern with no distress - normal breath sounds with no rales, rhonchi, wheezes or rubs  MS: no deformity or atrophy  Skin: warm and dry, no rash  Neuro:  Alert and Oriented x 3,  CN II-Xii grossly intact Psych: euthymic mood, appropriate affect and demeanor  Lab Results  Component Value Date   WBC 9.6 06/11/2023   HGB 13.1 06/11/2023   HCT 39.4 06/11/2023   PLT 289 06/11/2023   GLUCOSE 134 (H) 06/11/2023   CHOL 133 06/11/2023  TRIG 161 (H) 06/11/2023   HDL 45 06/11/2023   LDLCALC 61 06/11/2023   ALT 16 06/11/2023   AST 20 06/11/2023   NA 140 06/11/2023   K 4.6 06/11/2023   CL 103 06/11/2023   CREATININE 1.10 (H) 06/11/2023   BUN 9 06/11/2023   CO2 23 06/11/2023   TSH 2.880 06/11/2023   HGBA1C 6.8 (H) 06/11/2023      Assessment & Plan:   Problem List Items Addressed This Visit       Cardiovascular and Mediastinum   Hypertension associated with diabetes (HCC)   Relevant Orders   CBC with Differential/Platelet   Comprehensive metabolic panel Continue current meds     Endocrine   Hypothyroidism   Relevant Orders   TSH Continue med     Other   Hyperlipidemia associated with diabetes (HCC)   Relevant Orders   Lipid panel Continue meds and watch diet   Vitamin D insufficiency   Relevant Orders   VITAMIN D 25 Hydroxy (Vit-D Deficiency, Fractures)   Depression with anxiety Continue current med   Type 2 diabetes mellitus with hyperglycemia without use of long term insulin (HCC) Hgb A1c pending Watch diet Labwork pending Urine microalbumin ordered Other Visit Diagnoses     Restless leg syndrome       Relevant Medications   rOPINIRole (REQUIP) 2 MG tablet  Need flu shot Trivalent flucelvax given  Need COVID vaccine Pfizer  booster given  Screening for breast cancer Mammogram ordered     .  No orders of the defined types were placed in this encounter.   Orders Placed This Encounter  Procedures   MM 3D SCREENING MAMMOGRAM BILATERAL BREAST   Influenza, MDCK, trivalent, PF(Flucelvax egg-free)   Pfizer Comirnaty Covid -19 Vaccine 40yrs and older   CBC with Differential/Platelet   Comprehensive metabolic panel   TSH   Lipid panel   Hemoglobin A1c   VITAMIN D 25 Hydroxy (Vit-D Deficiency, Fractures)   Microalbumin / creatinine urine ratio     Follow-up: Return in about 6 months (around 06/14/2024) for chronic fasting follow-up.  An After Visit Summary was printed and given to the patient.  Jettie Pagan [facility] [phone]

## 2023-12-16 ENCOUNTER — Other Ambulatory Visit: Payer: Self-pay | Admitting: Physician Assistant

## 2023-12-16 DIAGNOSIS — E039 Hypothyroidism, unspecified: Secondary | ICD-10-CM

## 2023-12-16 LAB — COMPREHENSIVE METABOLIC PANEL
ALT: 14 [IU]/L (ref 0–32)
AST: 19 [IU]/L (ref 0–40)
Albumin: 4.3 g/dL (ref 3.8–4.9)
Alkaline Phosphatase: 96 [IU]/L (ref 44–121)
BUN/Creatinine Ratio: 14 (ref 12–28)
BUN: 14 mg/dL (ref 8–27)
Bilirubin Total: 0.3 mg/dL (ref 0.0–1.2)
CO2: 25 mmol/L (ref 20–29)
Calcium: 9.8 mg/dL (ref 8.7–10.3)
Chloride: 103 mmol/L (ref 96–106)
Creatinine, Ser: 1.01 mg/dL — ABNORMAL HIGH (ref 0.57–1.00)
Globulin, Total: 2.4 g/dL (ref 1.5–4.5)
Glucose: 102 mg/dL — ABNORMAL HIGH (ref 70–99)
Potassium: 5.3 mmol/L — ABNORMAL HIGH (ref 3.5–5.2)
Sodium: 141 mmol/L (ref 134–144)
Total Protein: 6.7 g/dL (ref 6.0–8.5)
eGFR: 64 mL/min/{1.73_m2} (ref >59)

## 2023-12-16 LAB — CBC WITH DIFFERENTIAL/PLATELET
Basophils Absolute: 0.1 10*3/uL (ref 0.0–0.2)
Basos: 1 %
EOS (ABSOLUTE): 0.1 10*3/uL (ref 0.0–0.4)
Eos: 1 %
Hematocrit: 40 % (ref 34.0–46.6)
Hemoglobin: 13.1 g/dL (ref 11.1–15.9)
Immature Grans (Abs): 0 10*3/uL (ref 0.0–0.1)
Immature Granulocytes: 0 %
Lymphocytes Absolute: 2.3 10*3/uL (ref 0.7–3.1)
Lymphs: 21 %
MCH: 30.8 pg (ref 26.6–33.0)
MCHC: 32.8 g/dL (ref 31.5–35.7)
MCV: 94 fL (ref 79–97)
Monocytes Absolute: 1 10*3/uL — ABNORMAL HIGH (ref 0.1–0.9)
Monocytes: 9 %
Neutrophils Absolute: 7.7 10*3/uL — ABNORMAL HIGH (ref 1.4–7.0)
Neutrophils: 68 %
Platelets: 291 10*3/uL (ref 150–450)
RBC: 4.26 x10E6/uL (ref 3.77–5.28)
RDW: 11.9 % (ref 11.7–15.4)
WBC: 11.1 10*3/uL — ABNORMAL HIGH (ref 3.4–10.8)

## 2023-12-16 LAB — LIPID PANEL
Chol/HDL Ratio: 2.8 {ratio} (ref 0.0–4.4)
Cholesterol, Total: 149 mg/dL (ref 100–199)
HDL: 54 mg/dL (ref >39)
LDL Chol Calc (NIH): 80 mg/dL (ref 0–99)
Triglycerides: 79 mg/dL (ref 0–149)
VLDL Cholesterol Cal: 15 mg/dL (ref 5–40)

## 2023-12-16 LAB — MICROALBUMIN / CREATININE URINE RATIO
Creatinine, Urine: 31.2 mg/dL
Microalb/Creat Ratio: <10 mg/g{creat} (ref 0–29)
Microalbumin, Urine: <3 ug/mL

## 2023-12-16 LAB — VITAMIN D 25 HYDROXY (VIT D DEFICIENCY, FRACTURES): Vit D, 25-Hydroxy: 78.2 ng/mL (ref 30.0–100.0)

## 2023-12-16 LAB — HEMOGLOBIN A1C
Est. average glucose Bld gHb Est-mCnc: 140 mg/dL
Hgb A1c MFr Bld: 6.5 % — ABNORMAL HIGH (ref 4.8–5.6)

## 2023-12-16 LAB — TSH: TSH: 1.94 u[IU]/mL (ref 0.450–4.500)

## 2023-12-17 MED ORDER — ROSUVASTATIN CALCIUM 10 MG PO TABS: 10.0000 mg | ORAL_TABLET | Freq: Every day | ORAL | 0 refills | Status: DC

## 2023-12-17 MED ORDER — LEVOTHYROXINE SODIUM 75 MCG PO TABS
75.0000 ug | ORAL_TABLET | Freq: Every day | ORAL | 0 refills | Status: DC
Start: 2023-12-17 — End: 2024-03-23

## 2023-12-22 ENCOUNTER — Other Ambulatory Visit: Payer: Self-pay | Admitting: Physician Assistant

## 2023-12-22 DIAGNOSIS — E039 Hypothyroidism, unspecified: Secondary | ICD-10-CM

## 2024-01-05 ENCOUNTER — Encounter: Payer: Self-pay | Admitting: Physician Assistant

## 2024-01-05 ENCOUNTER — Ambulatory Visit (INDEPENDENT_AMBULATORY_CARE_PROVIDER_SITE_OTHER): Payer: 59 | Admitting: Physician Assistant

## 2024-01-05 VITALS — BP 118/63 | HR 100 | Temp 97.4°F | Ht 64.0 in | Wt 170.8 lb

## 2024-01-05 DIAGNOSIS — M940 Chondrocostal junction syndrome [Tietze]: Secondary | ICD-10-CM | POA: Diagnosis not present

## 2024-01-05 DIAGNOSIS — K219 Gastro-esophageal reflux disease without esophagitis: Secondary | ICD-10-CM

## 2024-01-05 MED ORDER — PREDNISONE 20 MG PO TABS: ORAL_TABLET | ORAL | 0 refills | Status: DC

## 2024-01-05 MED ORDER — OMEPRAZOLE 40 MG PO CPDR: 40.0000 mg | DELAYED_RELEASE_CAPSULE | Freq: Every day | ORAL | 3 refills | Status: DC

## 2024-01-05 NOTE — Progress Notes (Signed)
 Acute Office Visit  Subjective:    Patient ID: Ebony Palmer, female    DOB: 01-03-1961, 63 y.o.   MRN: [id number]  Chief Complaint  Patient presents with   Cough/Indigestion    HPI Patient is in today for complaints of rib pain and chest wall pain.  She states last week she fell trying to put on clothes and stumbled across room and ran into a wall.  A few days later she has noted constant tenderness and soreness in her lower rib cage - states it is tender to the touch and soreness with deep breaths Describes as a tightness across both sides to her back - has been constant for a few days Denies chest pain/dyspnea  Pt also noted over the weekend she has had a lot of gassiness and belching - has taken Tums which helps symptoms - denies nausea or vomiting Has had some reflux No change in bms   Current Outpatient Medications:    DULoxetine  (CYMBALTA ) 60 MG capsule, TAKE 1 CAPSULE BY MOUTH EVERY DAY, Disp: 90 capsule, Rfl: 1   levothyroxine  (SYNTHROID ) 75 MCG tablet, Take 1 tablet (75 mcg total) by mouth daily., Disp: 90 tablet, Rfl: 0   omeprazole  (PRILOSEC) 40 MG capsule, Take 1 capsule (40 mg total) by mouth daily., Disp: 30 capsule, Rfl: 3   predniSONE  (DELTASONE ) 20 MG tablet, 1 po tid for 3 days then 1 po bid for 3 days then 1 po qd for 3 days, Disp: 18 tablet, Rfl: 0   rOPINIRole  (REQUIP ) 2 MG tablet, TAKE 1 TABLET BY MOUTH EVERYDAY AT BEDTIME, Disp: 90 tablet, Rfl: 0   rosuvastatin  (CRESTOR ) 10 MG tablet, Take 1 tablet (10 mg total) by mouth daily., Disp: 90 tablet, Rfl: 0   telmisartan  (MICARDIS ) 80 MG tablet, TAKE 1 TABLET BY MOUTH EVERY DAY, Disp: 90 tablet, Rfl: 0   Vitamin D , Ergocalciferol , (DRISDOL ) 1.25 MG (50000 UNIT) CAPS capsule, Take 1 capsule (50,000 Units total) by mouth every 7 (seven) days., Disp: 5 capsule, Rfl: 5   ketorolac  (ACULAR ) 0.5 % ophthalmic solution, Place 1 drop into the left eye 4 (four) times daily. (Patient not taking: Reported on 01/05/2024), Disp: ,  Rfl:    Moxifloxacin HCl 0.5 % SOLN, Place 1 drop into the left eye 4 (four) times daily. (Patient not taking: Reported on 01/05/2024), Disp: , Rfl:    prednisoLONE acetate (PRED FORTE) 1 % ophthalmic suspension, Place 1 drop into the left eye 4 (four) times daily. (Patient not taking: Reported on 01/05/2024), Disp: , Rfl:   Allergies  Allergen Reactions   Codeine Nausea Only, Nausea And Vomiting and Rash    ROS CONSTITUTIONAL: Negative for chills, fatigue, fever, E/N/T: Negative for ear pain, nasal congestion and sore throat.  CARDIOVASCULAR: Negative for chest pain, dizziness, palpitations and pedal edema.  RESPIRATORY: see HPI GASTROINTESTINAL: see HPI MSK: see HPI INTEGUMENTARY: Negative for rash.  NEUROLOGICAL: Negative for dizziness and headaches.      Objective:    PHYSICAL EXAM:   BP 118/63 (BP Location: Left Arm, Patient Position: Sitting)   Pulse 100   Temp (!) 97.4 F (36.3 C) (Temporal)   Ht 5' 4 (1.626 m)   Wt 170 lb 12.8 oz (77.5 kg)   LMP  (LMP Unknown)   SpO2 99%   BMI 29.32 kg/m    GEN: Well nourished, well developed, in no acute distress  HEENT: normal external ears and nose - normal external auditory canals and TMS - - Lips,  Teeth and Gums - normal  Oropharynx - normal mucosa, palate, and posterior pharynx Cardiac: RRR; no murmurs, rubs, or gallops,no edema - Respiratory:  normal respiratory rate and pattern with no distress - normal breath sounds with no rales, rhonchi, wheezes or rubs GI: normal bowel sounds, no masses or tenderness MS: no deformity or atrophy - palpable tenderness to lower rib cage on both sides but more on left side --- pain worsens with deep breathing and movements Skin: warm and dry, no rash       Assessment & Plan:    Gastroesophageal reflux disease without esophagitis -     Omeprazole ; Take 1 capsule (40 mg total) by mouth daily.  Dispense: 30 capsule; Refill: 3  Costochondritis -     predniSONE ; 1 po tid for 3 days then 1  po bid for 3 days then 1 po qd for 3 days  Dispense: 18 tablet; Refill: 0     Follow-up: Return if symptoms worsen or fail to improve.  An After Visit Summary was printed and given to the patient.  Ebony Palmer [facility] [phone]

## 2024-01-22 ENCOUNTER — Ambulatory Visit
Admission: RE | Admit: 2024-01-22 | Discharge: 2024-01-22 | Disposition: A | Discharge status: Home / Self Care | Payer: 59 | Source: Ambulatory Visit | Attending: Physician Assistant | Admitting: Physician Assistant

## 2024-01-22 DIAGNOSIS — Z1231 Encounter for screening mammogram for malignant neoplasm of breast: Secondary | ICD-10-CM

## 2024-03-02 ENCOUNTER — Other Ambulatory Visit: Payer: Self-pay | Admitting: Physician Assistant

## 2024-03-02 DIAGNOSIS — F418 Other specified anxiety disorders: Secondary | ICD-10-CM

## 2024-03-02 DIAGNOSIS — G2581 Restless legs syndrome: Secondary | ICD-10-CM

## 2024-03-23 ENCOUNTER — Other Ambulatory Visit: Payer: Self-pay | Admitting: Physician Assistant

## 2024-03-23 DIAGNOSIS — E039 Hypothyroidism, unspecified: Secondary | ICD-10-CM

## 2024-04-21 ENCOUNTER — Encounter: Payer: Self-pay | Admitting: Physician Assistant

## 2024-04-21 ENCOUNTER — Ambulatory Visit: Admitting: Physician Assistant

## 2024-04-21 VITALS — BP 108/70 | HR 60 | Temp 98.1°F | Resp 18 | Ht 64.0 in | Wt 170.6 lb

## 2024-04-21 DIAGNOSIS — M2559 Pain in other specified joint: Secondary | ICD-10-CM

## 2024-04-21 DIAGNOSIS — M791 Myalgia, unspecified site: Secondary | ICD-10-CM

## 2024-04-21 MED ORDER — PREDNISONE 20 MG PO TABS: ORAL_TABLET | ORAL | 0 refills | Status: DC

## 2024-04-21 MED ORDER — PREDNISONE 20 MG PO TABS
ORAL_TABLET | ORAL | 0 refills | Status: DC
Start: 2024-04-21 — End: 2024-04-21

## 2024-04-21 NOTE — Progress Notes (Signed)
 Acute Office Visit  Subjective:    Patient ID: Ebony Palmer, female    DOB: 09-14-1961, 63 y.o.   MRN: [id number]  Chief Complaint  Patient presents with   Joint Pain   Muscle Pain    HPI: Patient is in today for complaints of both hips, legs and arms have been achey and sore for the past 3-4 weeks.  She has not had any acute illnesses, fever, etc.  Says she has had no sore or swollen joints.  Denies tick exposure She does have family history of rheumatoid arthritis She did have prednisone  at home and took it last week which improved her symptoms   Current Outpatient Medications:    DULoxetine  (CYMBALTA ) 60 MG capsule, TAKE 1 CAPSULE BY MOUTH EVERY DAY, Disp: 90 capsule, Rfl: 0   levothyroxine  (SYNTHROID ) 75 MCG tablet, TAKE 1 TABLET BY MOUTH EVERY DAY, Disp: 90 tablet, Rfl: 0   rOPINIRole  (REQUIP ) 2 MG tablet, TAKE 1 TABLET BY MOUTH EVERYDAY AT BEDTIME, Disp: 90 tablet, Rfl: 0   rosuvastatin  (CRESTOR ) 10 MG tablet, Take 1 tablet (10 mg total) by mouth daily., Disp: 90 tablet, Rfl: 0   telmisartan  (MICARDIS ) 80 MG tablet, TAKE 1 TABLET BY MOUTH EVERY DAY, Disp: 90 tablet, Rfl: 0   predniSONE  (DELTASONE ) 20 MG tablet, 1 po tid for 3 days then 1 po bid for 3 days then 1 po qd for 3 days, Disp: 18 tablet, Rfl: 0  Allergies  Allergen Reactions   Codeine Nausea Only, Nausea And Vomiting and Rash    ROS CONSTITUTIONAL: Negative for chills, fatigue, fever,  E/N/T: Negative for ear pain, nasal congestion and sore throat.  CARDIOVASCULAR: Negative for chest pain, dizziness, palpitations and pedal edema.  RESPIRATORY: Negative for recent cough and dyspnea.  GASTROINTESTINAL: Negative for abdominal pain, acid reflux symptoms, constipation, diarrhea, nausea and vomiting.  MSK: see HPI INTEGUMENTARY: Negative for rash.  NEUROLOGICAL: Negative for dizziness and headaches.       Objective:    PHYSICAL EXAM:   BP 108/70 (BP Location: Left Arm, Patient Position: Sitting, Cuff Size:  Normal)   Pulse 60   Temp 98.1 F (36.7 C) (Temporal)   Resp 18   Ht 5\' 4"  (1.626 m)   Wt 170 lb 9.6 oz (77.4 kg)   LMP  (LMP Unknown)   SpO2 98%   BMI 29.28 kg/m    GEN: Well nourished, well developed, in no acute distress   Cardiac: RRR; no murmurs, rubs, or gallops,no edema -  Respiratory:  normal respiratory rate and pattern with no distress - normal breath sounds with no rales, rhonchi, wheezes or rubs  MS: no deformity or atrophy  Skin: warm and dry, no rash  Neuro:  Alert and Oriented x 3, - CN II-Xii grossly intact Psych: euthymic mood, appropriate affect and demeanor     Assessment & Plan:    Myalgia -     Rheumatoid factor -     CYCLIC CITRUL PEPTIDE ANTIBODY, IGG/IGA -     Sedimentation rate -     C-reactive protein -     ANA w/Reflex -     Uric acid -     Parvovirus B19 antibody, IgG and IgM -     CBC with Differential/Platelet -     Comprehensive metabolic panel with GFR -     TSH -     Spotted Fever Group Antibodies -     predniSONE ; 1 po tid for 3 days then 1  po bid for 3 days then 1 po qd for 3 days  Dispense: 18 tablet; Refill: 0 -     Lyme Disease Serology w/Reflex  Pain in other joint -     Rheumatoid factor -     CYCLIC CITRUL PEPTIDE ANTIBODY, IGG/IGA -     Sedimentation rate -     C-reactive protein -     ANA w/Reflex -     Uric acid -     Parvovirus B19 antibody, IgG and IgM -     CBC with Differential/Platelet -     Comprehensive metabolic panel with GFR -     TSH -     Spotted Fever Group Antibodies -     predniSONE ; 1 po tid for 3 days then 1 po bid for 3 days then 1 po qd for 3 days  Dispense: 18 tablet; Refill: 0 -     Lyme Disease Serology w/Reflex     Follow-up: Return if symptoms worsen or fail to improve, for and as scheduled for next chronic visit.  An After Visit Summary was printed and given to the patient.  Anthonette Bastos [facility] [phone]

## 2024-04-26 LAB — COMPREHENSIVE METABOLIC PANEL WITH GFR
ALT: 12 IU/L (ref 0–32)
AST: 13 IU/L (ref 0–40)
Albumin: 4.2 g/dL (ref 3.8–4.9)
Alkaline Phosphatase: 97 IU/L (ref 44–121)
BUN/Creatinine Ratio: 12 (ref 12–28)
BUN: 14 mg/dL (ref 8–27)
Bilirubin Total: 0.3 mg/dL (ref 0.0–1.2)
CO2: 24 mmol/L (ref 20–29)
Calcium: 9.9 mg/dL (ref 8.7–10.3)
Chloride: 105 mmol/L (ref 96–106)
Creatinine, Ser: 1.2 mg/dL — ABNORMAL HIGH (ref 0.57–1.00)
Globulin, Total: 3 g/dL (ref 1.5–4.5)
Glucose: 89 mg/dL (ref 70–99)
Potassium: 5.2 mmol/L (ref 3.5–5.2)
Sodium: 144 mmol/L (ref 134–144)
Total Protein: 7.2 g/dL (ref 6.0–8.5)
eGFR: 52 mL/min/{1.73_m2} — ABNORMAL LOW (ref >59)

## 2024-04-26 LAB — CBC WITH DIFFERENTIAL/PLATELET
Basophils Absolute: 0.1 10*3/uL (ref 0.0–0.2)
Basos: 1 %
EOS (ABSOLUTE): 0.2 10*3/uL (ref 0.0–0.4)
Eos: 1 %
Hematocrit: 42.3 % (ref 34.0–46.6)
Hemoglobin: 13.8 g/dL (ref 11.1–15.9)
Immature Grans (Abs): 0 10*3/uL (ref 0.0–0.1)
Immature Granulocytes: 0 %
Lymphocytes Absolute: 2.1 10*3/uL (ref 0.7–3.1)
Lymphs: 17 %
MCH: 30.1 pg (ref 26.6–33.0)
MCHC: 32.6 g/dL (ref 31.5–35.7)
MCV: 92 fL (ref 79–97)
Monocytes Absolute: 1.4 10*3/uL — ABNORMAL HIGH (ref 0.1–0.9)
Monocytes: 11 %
Neutrophils Absolute: 9 10*3/uL — ABNORMAL HIGH (ref 1.4–7.0)
Neutrophils: 70 %
Platelets: 294 10*3/uL (ref 150–450)
RBC: 4.59 x10E6/uL (ref 3.77–5.28)
RDW: 12.6 % (ref 11.7–15.4)
WBC: 12.8 10*3/uL — ABNORMAL HIGH (ref 3.4–10.8)

## 2024-04-26 LAB — SPOTTED FEVER GROUP ANTIBODIES
Spotted Fever Group IgG: <1:64 {titer}
Spotted Fever Group IgM: <1:64 {titer}

## 2024-04-26 LAB — CYCLIC CITRUL PEPTIDE ANTIBODY, IGG/IGA: Cyclic Citrullin Peptide Ab: 7 U (ref 0–19)

## 2024-04-26 LAB — PARVOVIRUS B19 ANTIBODY, IGG AND IGM
Parvovirus B19 IgG: 0.2 {index} (ref 0.0–0.8)
Parvovirus B19 IgM: 0.1 {index} (ref 0.0–0.8)

## 2024-04-26 LAB — C-REACTIVE PROTEIN: CRP: 20 mg/L — ABNORMAL HIGH (ref 0–10)

## 2024-04-26 LAB — TSH: TSH: 1.59 u[IU]/mL (ref 0.450–4.500)

## 2024-04-26 LAB — RHEUMATOID FACTOR: Rheumatoid fact SerPl-aCnc: <10 [IU]/mL (ref <14.0)

## 2024-04-26 LAB — URIC ACID: Uric Acid: 5.1 mg/dL (ref 3.0–7.2)

## 2024-04-26 LAB — SEDIMENTATION RATE: Sed Rate: 12 mm/h (ref 0–40)

## 2024-04-26 LAB — ANA W/REFLEX: Anti Nuclear Antibody (ANA): NEGATIVE

## 2024-04-27 ENCOUNTER — Encounter: Payer: Self-pay | Admitting: Physician Assistant

## 2024-04-27 LAB — LYME DISEASE SEROLOGY W/REFLEX: Lyme Total Antibody EIA: NEGATIVE

## 2024-04-27 NOTE — Telephone Encounter (Signed)
 Copied from CRM [id number]. Topic: Clinical - Lab/Test Results >> Apr 27, 2024  1:52 PM Ebony Palmer wrote: Reason for CRM: Patient called into get her lab results, read note verbatim from [name], 4/30. Patient verbalized understanding. Did not have any further questions. Patient did want to note per the results and the question about recent respiratory symptoms, she states she's had nothing more than what she would assume is allergies recently, and then also remembered she recently had pleurisy and wonders if that could have something to do with her elevated  WBC count.

## 2024-05-16 ENCOUNTER — Ambulatory Visit: Payer: Self-pay | Admitting: *Deleted

## 2024-05-16 NOTE — Telephone Encounter (Signed)
  Chief Complaint: bilateral leg pain Symptoms: chronic leg pain- prednisone  treatment helped- but now that patient has finished medication- the pain has returned.  Frequency: 2 weeks Pertinent Negatives: Patient denies swelling Disposition: [] ED /[] Urgent Care (no appt availability in office) / [x] Appointment(In office/virtual)/ []  [facility]/ [] Home Care/ [] Refused Recommended Disposition /[] [facility]/ []  Follow-up with PCP Additional Notes: Patient needs next steps for treatment.    Copied from CRM [id number]. Topic: Clinical - Red Word Triage >> May 16, 2024  4:01 PM Fonda T wrote: [facility] that prompted transfer to Nurse Triage: Pain in both legs, with difficulty moving Reason for Disposition  [1] MODERATE pain (e.g., interferes with normal activities, limping) AND [2] present > 3 days  Answer Assessment - Initial Assessment Questions 1. ONSET: "When did the pain start?"      2 weeks ago- pain in arms and legs- prednisone  was given- patient felt good on medication- now that she has been off- she is having the pain again  2. LOCATION: "Where is the pain located?"      Pain in upper legs 3. PAIN: "How bad is the pain?"    (Scale 1-10; or mild, moderate, severe)   -  MILD (1-3): doesn't interfere with normal activities    -  MODERATE (4-7): interferes with normal activities (e.g., work or school) or awakens from sleep, limping    -  SEVERE (8-10): excruciating pain, unable to do any normal activities, unable to walk     7/10 4. WORK OR EXERCISE: "Has there been any recent work or exercise that involved this part of the body?"      no 5. CAUSE: "What do you think is causing the leg pain?"     Movement- no pain when still/resting, leg cramping and pian with movement 6. OTHER SYMPTOMS: "Do you have any other symptoms?" (e.g., chest pain, back pain, breathing difficulty, swelling, rash, fever, numbness, weakness)     No other pain.  Protocols used: Leg  Pain-A-AH

## 2024-05-17 ENCOUNTER — Ambulatory Visit: Admitting: Physician Assistant

## 2024-05-17 ENCOUNTER — Encounter: Payer: Self-pay | Admitting: Physician Assistant

## 2024-05-17 VITALS — BP 118/70 | HR 90 | Temp 97.6°F | Resp 18 | Ht 64.0 in | Wt 170.2 lb

## 2024-05-17 DIAGNOSIS — L819 Disorder of pigmentation, unspecified: Secondary | ICD-10-CM

## 2024-05-17 DIAGNOSIS — M353 Polymyalgia rheumatica: Secondary | ICD-10-CM | POA: Diagnosis not present

## 2024-05-17 MED ORDER — PREDNISONE 5 MG PO TABS: ORAL_TABLET | ORAL | 0 refills | Status: DC

## 2024-05-17 NOTE — Progress Notes (Signed)
   Acute Office Visit  Subjective:    Patient ID: Ebony Palmer, female    DOB: 11/27/1961, 63 y.o.   MRN: [id number]  Chief Complaint  Patient presents with   Leg Pain    HPI: Patient is in today for complaints of persistent joint pains, leg pains and arm pain.  She states after being on prednisone  her symptoms improved and now a week off medication she can tell her symptoms are returning.  She had a rheumatoid panel done which did show elevated CRP otherwise normal - concerned this is PMR. Will start daily prednisone  treatment and refer to rheumatology  Pt has lesion on right upper leg that she snagged this morning while putting on clothes.  States it has been there all her life and no new changes of color but possibly slightly larger - would like dermatology referral for removal   Current Outpatient Medications:    DULoxetine  (CYMBALTA ) 60 MG capsule, TAKE 1 CAPSULE BY MOUTH EVERY DAY, Disp: 90 capsule, Rfl: 0   levothyroxine  (SYNTHROID ) 75 MCG tablet, TAKE 1 TABLET BY MOUTH EVERY DAY, Disp: 90 tablet, Rfl: 0   predniSONE  (DELTASONE ) 5 MG tablet, 1 po tid, Disp: 90 tablet, Rfl: 0   rOPINIRole  (REQUIP ) 2 MG tablet, TAKE 1 TABLET BY MOUTH EVERYDAY AT BEDTIME, Disp: 90 tablet, Rfl: 0   rosuvastatin  (CRESTOR ) 10 MG tablet, Take 1 tablet (10 mg total) by mouth daily., Disp: 90 tablet, Rfl: 0   telmisartan  (MICARDIS ) 80 MG tablet, TAKE 1 TABLET BY MOUTH EVERY DAY, Disp: 90 tablet, Rfl: 0  Allergies  Allergen Reactions   Codeine Nausea Only, Nausea And Vomiting and Rash    ROS CONSTITUTIONAL: Negative for chills, fatigue, fever, E/N/T: Negative for ear pain, nasal congestion and sore throat.   MSK: see HPI INTEGUMENTARY:see HPI NEUROLOGICAL: Negative for dizziness and headaches.  PSYCHIATRIC: Negative for sleep disturbance and to question depression screen.  Negative for depression, negative for anhedonia.      Objective:    PHYSICAL EXAM:   BP 118/70   Pulse 90   Temp 97.6  F (36.4 C) (Temporal)   Resp 18   Ht 5\' 4"  (1.626 m)   Wt 170 lb 3.2 oz (77.2 kg)   LMP  (LMP Unknown)   SpO2 98%   BMI 29.21 kg/m    GEN: Well nourished, well developed, in no acute distress  Cardiac: RRR; no murmurs, rubs, or gallops,no edema - Respiratory:  normal respiratory rate and pattern with no distress - normal breath sounds with no rales, rhonchi, wheezes or rubs  MS: no deformity or atrophy  Skin: warm and dry, - quarter size verrucal like lesion on upper leg  Psych: euthymic mood, appropriate affect and demeanor     Assessment & Plan:    Atypical pigmented skin lesion -     Ambulatory referral to Dermatology  PMR (polymyalgia rheumatica) (HCC) -     Ambulatory referral to Rheumatology -     predniSONE ; 1 po tid  Dispense: 90 tablet; Refill: 0     Follow-up: Return if symptoms worsen or fail to improve.  An After Visit Summary was printed and given to the patient.  Anthonette Bastos [facility] [phone]

## 2024-06-07 ENCOUNTER — Other Ambulatory Visit: Payer: Self-pay | Admitting: Physician Assistant

## 2024-06-07 DIAGNOSIS — G2581 Restless legs syndrome: Secondary | ICD-10-CM

## 2024-06-07 DIAGNOSIS — F418 Other specified anxiety disorders: Secondary | ICD-10-CM

## 2024-06-14 ENCOUNTER — Other Ambulatory Visit: Payer: Self-pay | Admitting: Physician Assistant

## 2024-06-14 ENCOUNTER — Encounter: Payer: Self-pay | Admitting: Physician Assistant

## 2024-06-14 ENCOUNTER — Ambulatory Visit (INDEPENDENT_AMBULATORY_CARE_PROVIDER_SITE_OTHER): Payer: 59 | Admitting: Physician Assistant

## 2024-06-14 VITALS — BP 128/76 | HR 70 | Temp 98.4°F | Resp 18 | Ht 64.0 in | Wt 170.0 lb

## 2024-06-14 DIAGNOSIS — E038 Other specified hypothyroidism: Secondary | ICD-10-CM

## 2024-06-14 DIAGNOSIS — M353 Polymyalgia rheumatica: Secondary | ICD-10-CM

## 2024-06-14 DIAGNOSIS — K219 Gastro-esophageal reflux disease without esophagitis: Secondary | ICD-10-CM

## 2024-06-14 DIAGNOSIS — E1165 Type 2 diabetes mellitus with hyperglycemia: Secondary | ICD-10-CM

## 2024-06-14 DIAGNOSIS — E1159 Type 2 diabetes mellitus with other circulatory complications: Secondary | ICD-10-CM

## 2024-06-14 DIAGNOSIS — I152 Hypertension secondary to endocrine disorders: Secondary | ICD-10-CM

## 2024-06-14 DIAGNOSIS — Z23 Encounter for immunization: Secondary | ICD-10-CM | POA: Diagnosis not present

## 2024-06-14 DIAGNOSIS — E559 Vitamin D deficiency, unspecified: Secondary | ICD-10-CM

## 2024-06-14 DIAGNOSIS — E785 Hyperlipidemia, unspecified: Secondary | ICD-10-CM

## 2024-06-14 DIAGNOSIS — M25562 Pain in left knee: Secondary | ICD-10-CM

## 2024-06-14 DIAGNOSIS — E1169 Type 2 diabetes mellitus with other specified complication: Secondary | ICD-10-CM

## 2024-06-14 DIAGNOSIS — G8929 Other chronic pain: Secondary | ICD-10-CM

## 2024-06-14 MED ORDER — PREDNISONE 5 MG PO TABS: ORAL_TABLET | ORAL | Status: DC

## 2024-06-14 NOTE — Progress Notes (Signed)
 Subjective:  Patient ID: Ebony Palmer, female    DOB: 04-Feb-1961  Age: 63 y.o. MRN: [id number]  Chief Complaint  Patient presents with   Medical Management of Chronic Issues    Hypertension    Pt presents for follow up of hypertension.The patient is tolerating the medication well without side effects. Compliance with treatment has been good; including taking medication as directed , maintains a healthy diet and regular exercise regimen , and following up as directed. She is currently taking micardis  80mg  qd Denies chest pain or dyspnea  Mixed hyperlipidemia  Pt presents with hyperlipidemia.  The patient is compliant with medications, maintains a low cholesterol diet , follows up as directed , and maintains an exercise regimen . The patient denies experiencing any hypercholesterolemia related symptoms. She is currently on crestor  10mg  qd  Pt  diagnosed with NIDDM at last visit - she has deferred seeing nutritionist - states that she is trying to watch diet Not checking glucose readings- is due for labwork  Pt uses requip  2mg  at night for restless leg syndrome - is helping her symptoms  Pt with  anxiety with depression - symptoms have been stable with current meds of cymbalta  60   Pt with hypothyroidism - currently on synthroid  75mcg qd - voices no concerns or problems - due to check TSH  Pt has had continued myalgias and labwork showed elevated CRP.  She has been taking prednisone  at a dose of 5mg  bid which has been helpful - she has also been referred to rheumatologist for further evaluation of possible PMR - her appt is not until November  Pt complains of left knee pain - she states she has had trouble for over a year.  Can remember a fall awhile back after a dog knocked her over and had issues intermittently since that time.  In the past several months she has noted that her left knee 'gives way' almost making her fall She requests ortho referral for further evaluation  Pt  requests pneumonia vaccine Current Outpatient Medications on File Prior to Visit  Medication Sig Dispense Refill   DULoxetine  (CYMBALTA ) 60 MG capsule TAKE 1 CAPSULE BY MOUTH EVERY DAY 90 capsule 0   levothyroxine  (SYNTHROID ) 75 MCG tablet TAKE 1 TABLET BY MOUTH EVERY DAY 90 tablet 0   rOPINIRole  (REQUIP ) 2 MG tablet TAKE 1 TABLET BY MOUTH EVERYDAY AT BEDTIME 90 tablet 0   rosuvastatin  (CRESTOR ) 10 MG tablet Take 1 tablet (10 mg total) by mouth daily. 90 tablet 0   telmisartan  (MICARDIS ) 80 MG tablet TAKE 1 TABLET BY MOUTH EVERY DAY 90 tablet 0   No current facility-administered medications on file prior to visit.   Past Medical History:  Diagnosis Date   Arthritis    Depression    Hyperlipidemia    Hypertension    Thyroid  disease    Past Surgical History:  Procedure Laterality Date   BACK SURGERY     BREAST CYST ASPIRATION Right    2016   CARPAL TUNNEL RELEASE Right 08/2023   CARPAL TUNNEL RELEASE Left 09/2023   CESAREAN SECTION     CHOLECYSTECTOMY     ROTATOR CUFF REPAIR Left 07/2023    History reviewed. No pertinent family history. Social History   Socioeconomic History   Marital status: Married    Spouse name: Not on file   Number of children: Not on file   Years of education: Not on file   Highest education level: 12th grade  Occupational History  Not on file  Tobacco Use   Smoking status: Never   Smokeless tobacco: Never  Vaping Use   Vaping status: Never Used  Substance and Sexual Activity   Alcohol use: Not Currently   Drug use: Never   Sexual activity: Yes    Partners: Male    Birth control/protection: Post-menopausal  Other Topics Concern   Not on file  Social History Narrative   Not on file   Social Drivers of Health   Financial Resource Strain: Medium Risk (12/11/2023)   Overall Financial Resource Strain (CARDIA)    Difficulty of Paying Living Expenses: Somewhat hard  Food Insecurity: No Food Insecurity (12/11/2023)   Hunger Vital Sign     Worried About Running Out of Food in the Last Year: Never true    Ran Out of Food in the Last Year: Never true  Transportation Needs: No Transportation Needs (12/11/2023)   PRAPARE - Administrator, Civil Service (Medical): No    Lack of Transportation (Non-Medical): No  Physical Activity: Unknown (12/11/2023)   Exercise Vital Sign    Days of Exercise per Week: 0 days    Minutes of Exercise per Session: Not on file  Stress: Stress Concern Present (12/11/2023)   [employer] of Occupational Health - Occupational Stress Questionnaire    Feeling of Stress : Very much  Social Connections: Moderately Isolated (12/11/2023)   Social Connection and Isolation Panel    Frequency of Communication with Friends and Family: More than three times a week    Frequency of Social Gatherings with Friends and Family: Patient declined    Attends Religious Services: Never    Database administrator or Organizations: No    Attends Engineer, structural: Not on file    Marital Status: Married   CONSTITUTIONAL: Negative for chills, fatigue, fever,  E/N/T: Negative for ear pain, nasal congestion and sore throat.  CARDIOVASCULAR: Negative for chest pain, dizziness, palpitations and pedal edema.  RESPIRATORY: Negative for recent cough and dyspnea.  GASTROINTESTINAL: Negative for abdominal pain, acid reflux symptoms, constipation, diarrhea, nausea and vomiting.  MSK: see HPI INTEGUMENTARY: Negative for rash.  NEUROLOGICAL: Negative for dizziness and headaches.  PSYCHIATRIC: Negative for sleep disturbance and to question depression screen.  Negative for depression, negative for anhedonia.      Objective:  PHYSICAL EXAM:   VS: BP 128/76   Pulse 70   Temp 98.4 F (36.9 C) (Temporal)   Resp 18   Ht 5' 4 (1.626 m)   Wt 170 lb (77.1 kg)   LMP  (LMP Unknown)   SpO2 98%   BMI 29.18 kg/m   GEN: Well nourished, well developed, in no acute distress   Cardiac: RRR; no murmurs, rubs,  or gallops,no edema - Respiratory:  normal respiratory rate and pattern with no distress - normal breath sounds with no rales, rhonchi, wheezes or rubs  MS: no deformity or atrophy - normal rom of left lower extremity -  Skin: warm and dry, no rash  Neuro:  Alert and Oriented x 3,- CN II-Xii grossly intact Psych: euthymic mood, appropriate affect and demeanor Diabetic Foot Exam - Simple   Simple Foot Form Diabetic Foot exam was performed with the following findings: Yes 06/14/2024  9:01 AM  Visual Inspection No deformities, no ulcerations, no other skin breakdown bilaterally: Yes Sensation Testing Intact to touch and monofilament testing bilaterally: Yes Pulse Check Posterior Tibialis and Dorsalis pulse intact bilaterally: Yes Comments      Lab  Results  Component Value Date   WBC 12.8 (H) 04/21/2024   HGB 13.8 04/21/2024   HCT 42.3 04/21/2024   PLT 294 04/21/2024   GLUCOSE 89 04/21/2024   CHOL 149 12/15/2023   TRIG 79 12/15/2023   HDL 54 12/15/2023   LDLCALC 80 12/15/2023   ALT 12 04/21/2024   AST 13 04/21/2024   NA 144 04/21/2024   K 5.2 04/21/2024   CL 105 04/21/2024   CREATININE 1.20 (H) 04/21/2024   BUN 14 04/21/2024   CO2 24 04/21/2024   TSH 1.590 04/21/2024   HGBA1C 6.5 (H) 12/15/2023      Assessment & Plan:   Problem List Items Addressed This Visit       Cardiovascular and Mediastinum   Hypertension associated with diabetes (HCC)   Relevant Orders   CBC with Differential/Platelet   Comprehensive metabolic panel Continue current meds     Endocrine   Hypothyroidism   Relevant Orders   TSH Continue med     Other   Hyperlipidemia associated with diabetes (HCC)   Relevant Orders   Lipid panel Continue meds and watch diet   Vitamin D  insufficiency   Relevant Orders   VITAMIN D  25 Hydroxy (Vit-D Deficiency, Fractures)   Depression with anxiety Continue current med   Type 2 diabetes mellitus with hyperglycemia without use of long term insulin  (HCC) Hgb A1c pending Watch diet Labwork pending  Other Visit Diagnoses     Restless leg syndrome       Relevant Medications   rOPINIRole  (REQUIP ) 2 MG tablet   Myalgias Continue prednisone   Follow up with rheumatologist as scheduled  Left knee pain Refer to ortho  Need pneumonia vaccine Prevnar 20 given     .  Meds ordered this encounter  Medications   predniSONE  (DELTASONE ) 5 MG tablet    Sig: 1 po bid    Supervising Provider:   Terrall Ferraris    Orders Placed This Encounter  Procedures   Pneumococcal conjugate vaccine 20-valent (Prevnar 20)   CBC with Differential/Platelet   Comprehensive metabolic panel with GFR   TSH   Lipid panel   VITAMIN D  25 Hydroxy (Vit-D Deficiency, Fractures)   Hemoglobin A1c   Ambulatory referral to Orthopedic Surgery     Follow-up: Return in about 6 months (around 12/14/2024) for chronic fasting follow-up.  An After Visit Summary was printed and given to the patient.  Anthonette Bastos [facility] [phone]

## 2024-06-15 ENCOUNTER — Ambulatory Visit: Payer: Self-pay | Admitting: Physician Assistant

## 2024-06-15 LAB — LIPID PANEL
Chol/HDL Ratio: 2.8 ratio (ref 0.0–4.4)
Cholesterol, Total: 155 mg/dL (ref 100–199)
HDL: 56 mg/dL (ref >39)
LDL Chol Calc (NIH): 81 mg/dL (ref 0–99)
Triglycerides: 101 mg/dL (ref 0–149)
VLDL Cholesterol Cal: 18 mg/dL (ref 5–40)

## 2024-06-15 LAB — COMPREHENSIVE METABOLIC PANEL WITH GFR
ALT: 9 IU/L (ref 0–32)
AST: 13 IU/L (ref 0–40)
Albumin: 4.4 g/dL (ref 3.8–4.9)
Alkaline Phosphatase: 78 IU/L (ref 44–121)
BUN/Creatinine Ratio: 12 (ref 12–28)
BUN: 13 mg/dL (ref 8–27)
Bilirubin Total: 0.5 mg/dL (ref 0.0–1.2)
CO2: 25 mmol/L (ref 20–29)
Calcium: 9.7 mg/dL (ref 8.7–10.3)
Chloride: 103 mmol/L (ref 96–106)
Creatinine, Ser: 1.07 mg/dL — ABNORMAL HIGH (ref 0.57–1.00)
Globulin, Total: 2.5 g/dL (ref 1.5–4.5)
Glucose: 97 mg/dL (ref 70–99)
Potassium: 4.5 mmol/L (ref 3.5–5.2)
Sodium: 143 mmol/L (ref 134–144)
Total Protein: 6.9 g/dL (ref 6.0–8.5)
eGFR: 59 mL/min/{1.73_m2} — ABNORMAL LOW (ref >59)

## 2024-06-15 LAB — HEMOGLOBIN A1C
Est. average glucose Bld gHb Est-mCnc: 148 mg/dL
Hgb A1c MFr Bld: 6.8 % — ABNORMAL HIGH (ref 4.8–5.6)

## 2024-06-15 LAB — CBC WITH DIFFERENTIAL/PLATELET
Basophils Absolute: 0.1 10*3/uL (ref 0.0–0.2)
Basos: 1 %
EOS (ABSOLUTE): 0.1 10*3/uL (ref 0.0–0.4)
Eos: 1 %
Hematocrit: 41.4 % (ref 34.0–46.6)
Hemoglobin: 13.2 g/dL (ref 11.1–15.9)
Immature Grans (Abs): 0 10*3/uL (ref 0.0–0.1)
Immature Granulocytes: 0 %
Lymphocytes Absolute: 1.7 10*3/uL (ref 0.7–3.1)
Lymphs: 16 %
MCH: 30.3 pg (ref 26.6–33.0)
MCHC: 31.9 g/dL (ref 31.5–35.7)
MCV: 95 fL (ref 79–97)
Monocytes Absolute: 1 10*3/uL — ABNORMAL HIGH (ref 0.1–0.9)
Monocytes: 9 %
Neutrophils Absolute: 7.8 10*3/uL — ABNORMAL HIGH (ref 1.4–7.0)
Neutrophils: 73 %
Platelets: 266 10*3/uL (ref 150–450)
RBC: 4.35 x10E6/uL (ref 3.77–5.28)
RDW: 13.4 % (ref 11.7–15.4)
WBC: 10.6 10*3/uL (ref 3.4–10.8)

## 2024-06-15 LAB — TSH: TSH: 1.56 u[IU]/mL (ref 0.450–4.500)

## 2024-06-15 LAB — VITAMIN D 25 HYDROXY (VIT D DEFICIENCY, FRACTURES): Vit D, 25-Hydroxy: 32.3 ng/mL (ref 30.0–100.0)

## 2024-06-26 ENCOUNTER — Other Ambulatory Visit: Payer: Self-pay | Admitting: Physician Assistant

## 2024-06-26 DIAGNOSIS — E039 Hypothyroidism, unspecified: Secondary | ICD-10-CM

## 2024-07-12 ENCOUNTER — Other Ambulatory Visit: Payer: Self-pay | Admitting: Physician Assistant

## 2024-07-12 DIAGNOSIS — M353 Polymyalgia rheumatica: Secondary | ICD-10-CM

## 2024-07-12 MED ORDER — PREDNISONE 5 MG PO TABS: ORAL_TABLET | ORAL | Status: DC

## 2024-07-12 MED ORDER — PREDNISONE 5 MG PO TABS: ORAL_TABLET | ORAL | 0 refills | Status: DC

## 2024-07-12 NOTE — Telephone Encounter (Signed)
 Copied from CRM [id number]. Topic: Clinical - Medication Refill >> Jul 12, 2024  2:32 PM Gustabo D wrote: Medication: predniSONE  (DELTASONE ) 5 MG tablet  Has the patient contacted their pharmacy? No (Agent: If no, request that the patient contact the pharmacy for the refill. If patient does not wish to contact the pharmacy document the reason why and proceed with request.) (Agent: If yes, when and what did the pharmacy advise?)  This is the patient's preferred pharmacy:  [id number] - [place], [place] - [address] [facility] [place] [address] Phone: [phone] Fax: [phone]  Is this the correct pharmacy for this prescription? Yes If no, delete pharmacy and type the correct one.   Has the prescription been filled recently? No  Is the patient out of the medication? Yes  Has the patient been seen for an appointment in the last year OR does the patient have an upcoming appointment? Yes  Can we respond through MyChart? Yes  Agent: Please be advised that Rx refills may take up to 3 business days. We ask that you follow-up with your pharmacy. >> Jul 12, 2024  2:33 PM Gustabo D wrote: Patient is in pain due to being out of medication and wants to know if she can get a prescription. She ran out a couple weeks ago

## 2024-07-13 ENCOUNTER — Other Ambulatory Visit: Payer: Self-pay | Admitting: Physician Assistant

## 2024-08-12 ENCOUNTER — Other Ambulatory Visit: Payer: Self-pay | Admitting: Physician Assistant

## 2024-08-12 DIAGNOSIS — M353 Polymyalgia rheumatica: Secondary | ICD-10-CM

## 2024-09-01 ENCOUNTER — Ambulatory Visit: Payer: Self-pay | Admitting: Physician Assistant

## 2024-09-01 ENCOUNTER — Ambulatory Visit (INDEPENDENT_AMBULATORY_CARE_PROVIDER_SITE_OTHER)
Admission: RE | Admit: 2024-09-01 | Discharge: 2024-09-01 | Disposition: A | Discharge status: Home / Self Care | Source: Ambulatory Visit | Attending: Physician Assistant | Admitting: Physician Assistant

## 2024-09-01 ENCOUNTER — Ambulatory Visit: Admitting: Physician Assistant

## 2024-09-01 VITALS — BP 116/70 | HR 88 | Temp 98.3°F | Resp 18 | Ht 64.0 in | Wt 171.6 lb

## 2024-09-01 DIAGNOSIS — M25552 Pain in left hip: Secondary | ICD-10-CM

## 2024-09-01 DIAGNOSIS — M79605 Pain in left leg: Secondary | ICD-10-CM

## 2024-09-01 DIAGNOSIS — M791 Myalgia, unspecified site: Secondary | ICD-10-CM

## 2024-09-01 MED ORDER — CELECOXIB 200 MG PO CAPS: 200.0000 mg | ORAL_CAPSULE | Freq: Every day | ORAL | 3 refills | Status: DC

## 2024-09-01 NOTE — Progress Notes (Signed)
 Acute Office Visit  Subjective:    Patient ID: Ebony Palmer, female    DOB: 28-Jun-1961, 63 y.o.   MRN: [id number]  Chief Complaint  Patient presents with   Arthritis    Legs    HPI: Patient is in today for follow up of myalgias.  Several months ago pt presented with probably Polymyalgia rheumatica symptoms with muscle and joint pains all over.  Arthritis panel was negative except for elevated CRP .  She has been treated with prednisone  since that time and appt made with rheumatologist but cannot get appt until November She states she ran out of prednisone  about a week ago and having muscle aches in her upper legs, left shoulder but especially left hip and upper leg.  She denies history of injury or trauma.  She states that before she had more generalized pain but now more focal at left hip She is taking ibuprofen as well   Current Outpatient Medications:     DULoxetine  (CYMBALTA ) 60 MG capsule, TAKE 1 CAPSULE BY MOUTH EVERY DAY, Disp: 90 capsule, Rfl: 0   levothyroxine  (SYNTHROID ) 75 MCG tablet, TAKE 1 TABLET BY MOUTH EVERY DAY, Disp: 90 tablet, Rfl: 0   rOPINIRole  (REQUIP ) 2 MG tablet, TAKE 1 TABLET BY MOUTH EVERYDAY AT BEDTIME, Disp: 90 tablet, Rfl: 0   rosuvastatin  (CRESTOR ) 10 MG tablet, TAKE 1 TABLET BY MOUTH EVERY DAY, Disp: 90 tablet, Rfl: 0   telmisartan  (MICARDIS ) 80 MG tablet, TAKE 1 TABLET BY MOUTH EVERY DAY, Disp: 90 tablet, Rfl: 0  Allergies  Allergen Reactions   Codeine Nausea Only, Nausea And Vomiting and Rash    ROS CONSTITUTIONAL: Negative for chills, fatigue, fever,  CARDIOVASCULAR: Negative for chest pain, dizziness RESPIRATORY: Negative for recent cough and dyspnea.  GASTROINTESTINAL: Negative for abdominal pain, acid reflux symptoms, constipation, diarrhea, nausea and vomiting.  MSK: see HPI     Objective:    PHYSICAL EXAM:   BP 116/70   Pulse 88   Temp 98.3 F (36.8 C) (Temporal)   Resp 18   Ht 5' 4 (1.626 m)   Wt 171 lb 9.6 oz (77.8 kg)    LMP  (LMP Unknown)   SpO2 96%   BMI 29.46 kg/m    GEN: Well nourished, well developed, in no acute distress   Cardiac: RRR; no murmurs, Respiratory:  normal respiratory rate and pattern with no distress - normal breath sounds with no rales, rhonchi, wheezes or rubs  MS: moderate tenderness to palpation of left femur and left hip to palpation - no redness of area or other joints Skin: warm and dry, no rash   Psych: euthymic mood, appropriate affect and demeanor     Assessment & Plan:    Myalgia -     Rheumatoid factor -     CYCLIC CITRUL PEPTIDE ANTIBODY, IGG/IGA -     Sedimentation rate -     C-reactive protein -     ANA w/Reflex -     Uric acid -     Parvovirus B19 antibody, IgG and IgM -     DG HIP UNILAT W OR W/O PELVIS 2-3 VIEWS LEFT; Future -     DG FEMUR MIN 2 VIEWS LEFT; Future -     Celecoxib ; Take 1 capsule (200 mg total) by mouth daily.  Dispense: 30 capsule; Refill: 3  Left hip pain -     Rheumatoid factor -     CYCLIC CITRUL PEPTIDE ANTIBODY, IGG/IGA -  Sedimentation rate -     C-reactive protein -     ANA w/Reflex -     Uric acid -     Parvovirus B19 antibody, IgG and IgM -     DG HIP UNILAT W OR W/O PELVIS 2-3 VIEWS LEFT; Future -     DG FEMUR MIN 2 VIEWS LEFT; Future -     Celecoxib ; Take 1 capsule (200 mg total) by mouth daily.  Dispense: 30 capsule; Refill: 3     Follow-up: Return for at next chronic visit.  An After Visit Summary was printed and given to the patient.  CAMIE JONELLE NICHOLAUS DEVONNA [facility] [phone]

## 2024-09-02 LAB — URIC ACID: Uric Acid: 5.2 mg/dL (ref 3.0–7.2)

## 2024-09-02 LAB — SEDIMENTATION RATE: Sed Rate: 6 mm/h (ref 0–40)

## 2024-09-02 LAB — PARVOVIRUS B19 ANTIBODY, IGG AND IGM
Parvovirus B19 IgG: 0.5 {index} (ref 0.0–0.8)
Parvovirus B19 IgM: 0.2 {index} (ref 0.0–0.8)

## 2024-09-02 LAB — ANA W/REFLEX: Anti Nuclear Antibody (ANA): NEGATIVE

## 2024-09-02 LAB — CYCLIC CITRUL PEPTIDE ANTIBODY, IGG/IGA: Cyclic Citrullin Peptide Ab: 2 U (ref 0–19)

## 2024-09-02 LAB — RHEUMATOID FACTOR: Rheumatoid fact SerPl-aCnc: <10 [IU]/mL (ref <14.0)

## 2024-09-02 LAB — C-REACTIVE PROTEIN: CRP: 27 mg/L — ABNORMAL HIGH (ref 0–10)

## 2024-09-10 ENCOUNTER — Other Ambulatory Visit: Payer: Self-pay | Admitting: Physician Assistant

## 2024-09-10 DIAGNOSIS — F418 Other specified anxiety disorders: Secondary | ICD-10-CM

## 2024-09-10 DIAGNOSIS — G2581 Restless legs syndrome: Secondary | ICD-10-CM

## 2024-09-14 ENCOUNTER — Ambulatory Visit: Payer: Self-pay

## 2024-09-14 NOTE — Telephone Encounter (Signed)
 FYI Only or Action Required?: FYI only for provider.  Patient was last seen in primary care on 09/01/2024 by Ebony Credit, PA-C.  Called Nurse Triage reporting leg pain.  Symptoms began several months ago.  Interventions attempted: Prescription medications: celebrex .  Symptoms are: unchanged.  Triage Disposition: See PCP When Office is Open (Within 3 Days)  Patient/caregiver understands and will follow disposition?: Yes    Copied from CRM [id number]. Topic: Clinical - Red Word Triage >> Sep 14, 2024 11:28 AM Ebony Palmer wrote: Red Word that prompted transfer to Nurse Triage:  Right and left leg pain. Patient is on new medication (celecoxib  (CELEBREX ) 200 MG capsule) for it and it is not working. Pain level 7. Pain is described as aching pain  Patient would like an appt with PCP Reason for Disposition  [1] MODERATE pain (e.g., interferes with normal activities, limping) AND [2] present > 3 days  Answer Assessment - Initial Assessment Questions 1. ONSET: When did the pain start?      Several months ago and was seen in the office and started on celebrex .  2. LOCATION: Where is the pain located?      Both legs 3. PAIN: How bad is the pain?    (Scale 1-10; or mild, moderate, severe)     7/10 4. WORK OR EXERCISE: Has there been any recent work or exercise that involved this part of the body?      no 5. CAUSE: What do you think is causing the leg pain?     unknown 6. OTHER SYMPTOMS: Do you have any other symptoms? (e.g., chest pain, back pain, breathing difficulty, swelling, rash, fever, numbness, weakness)     denies 7. PREGNANCY: Is there any chance you are pregnant? When was your last menstrual period?     na  Protocols used: Leg Pain-A-AH

## 2024-09-15 ENCOUNTER — Ambulatory Visit: Admitting: Physician Assistant

## 2024-09-15 ENCOUNTER — Encounter: Payer: Self-pay | Admitting: Physician Assistant

## 2024-09-15 VITALS — BP 118/74 | HR 88 | Temp 98.2°F | Resp 18 | Ht 64.0 in | Wt 174.0 lb

## 2024-09-15 DIAGNOSIS — M353 Polymyalgia rheumatica: Secondary | ICD-10-CM | POA: Diagnosis not present

## 2024-09-15 MED ORDER — PREDNISONE 10 MG PO TABS: 10.0000 mg | ORAL_TABLET | Freq: Every day | ORAL | 0 refills | Status: DC

## 2024-09-15 NOTE — Progress Notes (Signed)
   Acute Office Visit  Subjective:    Patient ID: Ebony Palmer, female    DOB: January 26, 1961, 63 y.o.   MRN: [id number]  Chief Complaint  Patient presents with   Arthritis    Legs and arms    HPI: Patient is in today for follow up of extremity pain.  She states that since she started celebrex  she has not had much relief with that medication and then this past week her muscle pains have worsened.  She describes as tightness and tenseness in both her upper arms and both of her upper legs.  She does have some joint pain in bilateral knees as well Labwork has been done which has shown slight rise in CRP but normal sed rate.  She has been referred to rheumatologist but appt not until November.  When given prednisone  symptoms have resolved but then recur after medication has completed course Given history and response to prednisone  do feel this is polymyalgia rheumatica and will plan to start low dose prednisone  until her appt with rheumatology   Current Outpatient Medications:    celecoxib  (CELEBREX ) 200 MG capsule, Take 1 capsule (200 mg total) by mouth daily., Disp: 30 capsule, Rfl: 3   DULoxetine  (CYMBALTA ) 60 MG capsule, TAKE 1 CAPSULE BY MOUTH EVERY DAY, Disp: 90 capsule, Rfl: 0   levothyroxine  (SYNTHROID ) 75 MCG tablet, TAKE 1 TABLET BY MOUTH EVERY DAY, Disp: 90 tablet, Rfl: 0   predniSONE  (DELTASONE ) 10 MG tablet, Take 1 tablet (10 mg total) by mouth daily with breakfast., Disp: 90 tablet, Rfl: 0   rOPINIRole  (REQUIP ) 2 MG tablet, TAKE 1 TABLET BY MOUTH EVERYDAY AT BEDTIME, Disp: 90 tablet, Rfl: 0   rosuvastatin  (CRESTOR ) 10 MG tablet, TAKE 1 TABLET BY MOUTH EVERY DAY, Disp: 90 tablet, Rfl: 0   telmisartan  (MICARDIS ) 80 MG tablet, TAKE 1 TABLET BY MOUTH EVERY DAY, Disp: 90 tablet, Rfl: 0  Allergies  Allergen Reactions   Codeine Nausea Only, Nausea And Vomiting and Rash    ROS CONSTITUTIONAL: Negative for chills, fatigue, fever,  CARDIOVASCULAR: Negative for chest pain, dizziness,  palpitations and pedal edema.  RESPIRATORY: Negative for recent cough and dyspnea.  GASTROINTESTINAL: Negative for abdominal pain, acid reflux symptoms, constipation, diarrhea, nausea and vomiting.  MSK: see HPI INTEGUMENTARY: Negative for rash.       Objective:    PHYSICAL EXAM:   BP 118/74   Pulse 88   Temp 98.2 F (36.8 C) (Temporal)   Resp 18   Ht 5' 4 (1.626 m)   Wt 174 lb (78.9 kg)   LMP  (LMP Unknown)   SpO2 98%   BMI 29.87 kg/m    GEN: Well nourished, well developed, in no acute distress  Cardiac: RRR; no murmurs, rubs,  Respiratory:  normal respiratory rate and pattern with no distress - normal breath sounds with no rales, rhonchi, wheezes or rubs MS: no deformity or atrophy  Skin: warm and dry, no rash       Assessment & Plan:    Polymyalgia rheumatica (HCC) -     predniSONE ; Take 1 tablet (10 mg total) by mouth daily with breakfast.  Dispense: 90 tablet; Refill: 0   Recommend daily vit D and calcium  supplement Follow up with rheumatologist as directed  Follow-up: Return for as scheduled for next chronic visit.  An After Visit Summary was printed and given to the patient.  CAMIE JONELLE NICHOLAUS DEVONNA [facility] [phone]

## 2024-09-24 ENCOUNTER — Other Ambulatory Visit: Payer: Self-pay | Admitting: Physician Assistant

## 2024-09-24 DIAGNOSIS — E039 Hypothyroidism, unspecified: Secondary | ICD-10-CM

## 2024-10-11 ENCOUNTER — Other Ambulatory Visit: Payer: Self-pay | Admitting: Physician Assistant

## 2024-10-19 NOTE — Progress Notes (Signed)
 Office Visit Note  Patient: Ebony Palmer             Date of Birth: [birth year]           MRN: [id number]             PCP: Nicholaus Credit, PA-C Referring: Nicholaus Credit, PA-C Visit Date: 11/02/2024 Occupation: Data Unavailable  Subjective:  Pain in multiple joints  History of Present Illness: Ebony Palmer is a 63 y.o. female seen for the evaluation of polyarthralgia and elevated CRP.  According the patient her symptoms started March 2025 with left hip pain which she describes over the left trochanteric region.  She states the pain gradually got worse and she started having pain and stiffness in her knee joints.  She also started noticing swelling in her right knee.  She was having stiffness and all of her joints.  She reports morning stiffness and also stiffness after prolonged sitting.  She states she was seen by her PCP at the time all her serology was negative except for elevated CRP.  She was given the possible diagnosis of polymyalgia rheumatica and started on prednisone  5 mg p.o. daily.  Patient noted no improvement on 5 mg of prednisone .  The dose of prednisone  was increased to 10 mg p.o. daily which helped her to some extent.  She still continued to have discomfort.  Celebrex  was added about 6 weeks ago which is helping to some extent.  She continues to have discomfort in her neck, upper thoracic region, some stiffness in her hands, hips and knee joints.  She states she still gets swelling in her right knee joint.  None of the other joints are painful or swollen.  She denies any history of psoriasis.  There is no family history of psoriasis.  There is family history of rheumatoid arthritis in her sister. 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  She enjoys crafts and camping.  She is married, gravida 2, para 2.  There is no history of preeclampsia or DVTs.  She does not drink and never been a smoker.    Activities of Daily Living:  Patient reports morning stiffness for 1-2  hours.   Patient Reports nocturnal pain.  Difficulty dressing/grooming: Reports Difficulty climbing stairs: Reports Difficulty getting out of chair: Reports Difficulty using hands for taps, buttons, cutlery, and/or writing: Reports  Review of Systems  Constitutional:  Positive for fatigue.  HENT:  Positive for mouth dryness. Negative for mouth sores.   Eyes:  Negative for dryness.  Respiratory:  Negative for shortness of breath.   Cardiovascular:  Negative for chest pain and palpitations.  Gastrointestinal:  Negative for blood in stool, constipation and diarrhea.  Endocrine: Negative for increased urination.  Genitourinary:  Negative for involuntary urination.  Musculoskeletal:  Positive for joint pain, gait problem, joint pain, joint swelling, myalgias, muscle weakness, morning stiffness, muscle tenderness and myalgias.  Skin:  Negative for color change, rash, hair loss and sensitivity to sunlight.  Allergic/Immunologic: Negative for susceptible to infections.  Neurological:  Positive for dizziness and headaches.  Hematological:  Negative for swollen glands.  Psychiatric/Behavioral:  Positive for depressed mood and sleep disturbance. The patient is nervous/anxious.     PMFS History:  Patient Active Problem List   Diagnosis Date Noted   Type 2 diabetes mellitus with hyperglycemia, without long-term current use of insulin (HCC) 12/15/2023   Needs flu shot 12/15/2023   Need for COVID-19 vaccine 12/15/2023   Encounter for  screening mammogram for breast cancer 12/15/2023   Bronchitis 03/20/2021   Hyperlipidemia 02/26/2021   Hypothyroidism 02/26/2021   Inflammatory arthritis 02/26/2021   Osteoarthritis 02/26/2021   Benign hypertension 02/26/2021   Vitamin D  insufficiency 02/26/2021   Neck pain 02/26/2021   Depression with anxiety 02/26/2021   Carpal tunnel syndrome, bilateral 06/22/2020   Paresthesia of both hands 06/22/2020   Ulnar neuropathy of both upper extremities 06/22/2020    Depressive disorder 06/14/2019   Disorder of vulva 06/14/2019   Hypertensive disorder 06/14/2019   Low back pain 06/14/2019   Postoperative examination 06/14/2019   Calculus of gallbladder with chronic cholecystitis without obstruction 05/17/2019   Encounter for screening colonoscopy 05/17/2019   Epigastric abdominal pain 05/17/2019   Breast mass seen on mammogram 10/30/2015   Polyp of cervix 07/29/2011    Past Medical History:  Diagnosis Date   Arthritis    Depression    Hyperlipidemia    Hypertension    Thyroid  disease     Family History  Problem Relation Age of Onset   Heart Problems Mother    Thyroid  disease Mother    Cancer Mother    Heart Problems Father    Rheum arthritis Sister    Hashimoto's thyroiditis Grandson    Past Surgical History:  Procedure Laterality Date   BACK SURGERY     BREAST CYST ASPIRATION Right    2016   CARPAL TUNNEL RELEASE Right 08/2023   CARPAL TUNNEL RELEASE Left 09/2023   CESAREAN SECTION     CHOLECYSTECTOMY     ROTATOR CUFF REPAIR Left 07/2023   ROTATOR CUFF REPAIR Right    Social History   Tobacco Use   Smoking status: Never    Passive exposure: Current   Smokeless tobacco: Never  Vaping Use   Vaping status: Never Used  Substance Use Topics   Alcohol use: Not Currently   Drug use: Never   Social History   Social History Narrative   Not on file     Immunization History  Administered Date(s) Administered   Influenza Inj Mdck Quad Pf 01/28/2022, 12/01/2022   Influenza, Mdck, Trivalent,PF 6+ MOS(egg free) 12/15/2023   Moderna SARS-COV2 Booster Vaccination 07/16/2021   Moderna Sars-Covid-2 Vaccination 05/23/2020, 06/22/2020   PNEUMOCOCCAL CONJUGATE-20 06/14/2024   Pfizer(Comirnaty)Fall Seasonal Vaccine 12 years and older 12/01/2022, 12/15/2023   Tdap 08/25/2022     Objective: Vital Signs: BP (!) 146/77 (BP Location: Right Arm, Patient Position: Sitting, Cuff Size: Normal)   Pulse 78   Temp (!) 97.4 F (36.3 C)    Resp 15   Ht 5' 4 (1.626 m)   Wt 174 lb 12.8 oz (79.3 kg)   LMP  (LMP Unknown)   BMI 30.00 kg/m    Physical Exam Vitals and nursing note reviewed.  Constitutional:      Appearance: She is well-developed.  HENT:     Head: Normocephalic and atraumatic.  Eyes:     Conjunctiva/sclera: Conjunctivae normal.  Cardiovascular:     Rate and Rhythm: Normal rate and regular rhythm.     Heart sounds: Normal heart sounds.  Pulmonary:     Effort: Pulmonary effort is normal.     Breath sounds: Normal breath sounds.  Abdominal:     General: Bowel sounds are normal.     Palpations: Abdomen is soft.  Musculoskeletal:     Cervical back: Normal range of motion.  Lymphadenopathy:     Cervical: No cervical adenopathy.  Skin:    General: Skin is warm and  dry.     Capillary Refill: Capillary refill takes less than 2 seconds.  Neurological:     Mental Status: She is alert and oriented to person, place, and time.  Psychiatric:        Behavior: Behavior normal.      Musculoskeletal Exam: Cervical spine was in good range of motion with no stiffness.  She had tenderness in the trapezius region.  She had no tenderness over thoracic or lumbar region.  She had limited range of motion of the lumbar spine.  Shoulders, elbows, wrist joints, MCPs, PIPs and DIPs were in good range of motion with no synovitis.  Bilateral PIP and DIP thickening was noted.  Hip joints with good range of motion.  She had tenderness over bilateral trochanteric bursa.  Knee joints with good range of motion without any warmth swelling or effusion.  There was no tenderness or swelling over ankles or MTPs.  First MTP and PIP and DIP thickening was noted.    CDAI Exam: CDAI Score: -- Patient Global: --; Provider Global: -- Swollen: --; Tender: -- Joint Exam 11/02/2024   No joint exam has been documented for this visit   There is currently no information documented on the homunculus. Go to the Rheumatology activity and complete  the homunculus joint exam.  Investigation: No additional findings.  Imaging: No results found.  Recent Labs: Lab Results  Component Value Date   WBC 10.6 06/14/2024   HGB 13.2 06/14/2024   PLT 266 06/14/2024   NA 143 06/14/2024   K 4.5 06/14/2024   CL 103 06/14/2024   CO2 25 06/14/2024   GLUCOSE 97 06/14/2024   BUN 13 06/14/2024   CREATININE 1.07 (H) 06/14/2024   BILITOT 0.5 06/14/2024   ALKPHOS 78 06/14/2024   AST 13 06/14/2024   ALT 9 06/14/2024   PROT 6.9 06/14/2024   ALBUMIN 4.4 06/14/2024   CALCIUM  9.7 06/14/2024   April 21, 2024 [facility] spotted fever negative, parvovirus negative, RF negative, anti-CCP negative, sed rate 12, CRP 20, ANA negative, uric acid 5.1, TSH normal, Lyme negative June 14, 2024 lipid panel normal, hemoglobin A1c 6.8, vitamin D  32.3 September 01, 2024 parvovirus negative, RF negative, anti-CCP negative, uric acid 5.2, ANA negative, CRP 27, sed rate 6  Speciality Comments: No specialty comments available.  Procedures:  No procedures performed Allergies: Codeine   Assessment / Plan:     Visit Diagnoses: PMR (polymyalgia rheumatica)-patient was given the diagnosis of polymyalgia rheumatica in April due to pain and her hips and elevated CRP.  Patient states she did not have a good response to prednisone  and continues to have pain and stiffness.  Celebrex  was added about 6 weeks ago.  She states the symptoms started with left hip pain which she describes over the left trochanteric region in the right knee joint pain and swelling.  She denies any history of myalgias.  She was having generalized stiffness.  No muscular weakness or tenderness was noted on the examination today.  She had no temporal artery tenderness.  I am uncertain about the diagnosis of PMR at this point.  Inflammatory arthritis -she gives history of pain and swelling in her right knee joint off-and-on.  No synovitis was noted on the examination.  She gives history of stiffness in  her neck which was in the trapezius region.  She also has chronic lower back pain.  She gives history of intermittent discomfort in her hands and her right knee.  She has discomfort in  her trochanteric region.  There is family history of rheumatoid arthritis in her sister.  Possibility of chronic inflammatory arthritis most likely seronegative rheumatoid arthritis was discussed.  I also discussed the use of hydroxychloroquine as a steroid sparing agent.  Side effects were discussed.  Patient is ready to proceed with hydroxychloroquine.  Handout was given and consent was taken.  I will obtain labs today.  Plan: Mutated Citrullinated Vimentin (MCV) Antibody, Sedimentation rate, C-reactive protein.  Current chronic use of systemic steroids -hemoglobin A1c was elevated at 6.8 on June 14, 2024.  She is also diabetic and followed by her PCP.  I did detailed discussion about the long-term side effects of prednisone .  Will taper prednisone  by 2.5 mg every 4 weeks as she has been on prednisone  for a long time.  High risk medication use -will be starting her on Plaquenil 200 mg p.o. twice daily.  She was advised to get baseline eye examination and then annual eye examination to monitor for ocular toxicity.  Information reimmunization was also placed in the AVS.  Plan: Glucose 6 phosphate dehydrogenase, CBC with Differential/Platelet, Comprehensive metabolic panel with GFR  Neck pain -she gives history of neck pain and stiffness mostly in the trapezius region.  Plan: XR Cervical Spine 2 or 3 views.  X-rays showed multilevel spondylosis with C5-C6 narrowing and a large osteophyte between C4 and C5.  Pain in both hands -she gives history of intermittent pain and discomfort in her hands.  Bilateral PIP and DIP thickening with no synovitis was noted.  I like to see how she does without prednisone .  She has been also taking Celebrex .  I advised her to discontinue Celebrex  as there is increased risk of GI bleed with  concomitant use of prednisone  and Celebrex .  Plan: XR Hand 2 View Right, XR Hand 2 View Left.  X-rays of bilateral hands with history of osteoarthritis.  Normal  Trochanteric bursitis of both hips -she has been experiencing her tenderness over bilateral trochanteric bursa.  Chronic pain of both knees -she gives history of some discomfort in her both knees and intermittent swelling in her right knee joint.  No warmth swelling or effusion was noted today.  Plan: XR KNEE 3 VIEW RIGHT, XR KNEE 3 VIEW LEFT.  X-rays of bilateral knee joints were unremarkable.  Chronic midline low back pain without sciatica-I reviewed her previous x-rays which are consistent with degenerative disc disease of the lumbar spine.  Other fatigue -she complains of fatigue.  Plan: CK  Mixed hyperlipidemia-she is currently on Crestor  10 mg p.o. daily.  Primary hypertension-blood pressure was 146/77.  She is on Micardis .  Coming off Celebrex  should help.  Type 2 diabetes mellitus with hyperglycemia, without long-term current use of insulin (HCC)-she was advised to monitor blood glucose closely while she is on prednisone .  Vitamin D  insufficiency  Other specified hypothyroidism  Depression with anxiety  Family history of rheumatoid arthritis-Sister  Orders: Orders Placed This Encounter  Procedures   XR Hand 2 View Right   XR Hand 2 View Left   XR KNEE 3 VIEW RIGHT   XR KNEE 3 VIEW LEFT   XR Cervical Spine 2 or 3 views   Mutated Citrullinated Vimentin (MCV) Antibody   Sedimentation rate   C-reactive protein   CK   Glucose 6 phosphate dehydrogenase   CBC with Differential/Platelet   Comprehensive metabolic panel with GFR   No orders of the defined types were placed in this encounter.    Follow-Up Instructions: Return  for Chronic inflammatory arthritis.   Maya Nash, MD  Note - This record has been created using Animal nutritionist.  Chart creation errors have been sought, but may not always  have  been located. Such creation errors do not reflect on  the standard of medical care.

## 2024-11-02 ENCOUNTER — Ambulatory Visit

## 2024-11-02 ENCOUNTER — Ambulatory Visit: Attending: Rheumatology | Admitting: Rheumatology

## 2024-11-02 ENCOUNTER — Encounter: Payer: Self-pay | Admitting: Rheumatology

## 2024-11-02 ENCOUNTER — Telehealth: Payer: Self-pay | Admitting: Pharmacist

## 2024-11-02 VITALS — BP 146/77 | HR 78 | Temp 97.4°F | Resp 15 | Ht 64.0 in | Wt 174.8 lb

## 2024-11-02 DIAGNOSIS — M79642 Pain in left hand: Secondary | ICD-10-CM | POA: Diagnosis not present

## 2024-11-02 DIAGNOSIS — M542 Cervicalgia: Secondary | ICD-10-CM

## 2024-11-02 DIAGNOSIS — M25561 Pain in right knee: Secondary | ICD-10-CM | POA: Diagnosis not present

## 2024-11-02 DIAGNOSIS — E038 Other specified hypothyroidism: Secondary | ICD-10-CM

## 2024-11-02 DIAGNOSIS — M7061 Trochanteric bursitis, right hip: Secondary | ICD-10-CM

## 2024-11-02 DIAGNOSIS — Z79899 Other long term (current) drug therapy: Secondary | ICD-10-CM

## 2024-11-02 DIAGNOSIS — M1380 Other specified arthritis, unspecified site: Secondary | ICD-10-CM

## 2024-11-02 DIAGNOSIS — R5383 Other fatigue: Secondary | ICD-10-CM

## 2024-11-02 DIAGNOSIS — E782 Mixed hyperlipidemia: Secondary | ICD-10-CM

## 2024-11-02 DIAGNOSIS — M353 Polymyalgia rheumatica: Secondary | ICD-10-CM

## 2024-11-02 DIAGNOSIS — Z7952 Long term (current) use of systemic steroids: Secondary | ICD-10-CM

## 2024-11-02 DIAGNOSIS — G8929 Other chronic pain: Secondary | ICD-10-CM

## 2024-11-02 DIAGNOSIS — M79641 Pain in right hand: Secondary | ICD-10-CM

## 2024-11-02 DIAGNOSIS — I10 Essential (primary) hypertension: Secondary | ICD-10-CM

## 2024-11-02 DIAGNOSIS — M25562 Pain in left knee: Secondary | ICD-10-CM

## 2024-11-02 DIAGNOSIS — F418 Other specified anxiety disorders: Secondary | ICD-10-CM

## 2024-11-02 DIAGNOSIS — G5623 Lesion of ulnar nerve, bilateral upper limbs: Secondary | ICD-10-CM

## 2024-11-02 DIAGNOSIS — M545 Low back pain, unspecified: Secondary | ICD-10-CM

## 2024-11-02 DIAGNOSIS — M7062 Trochanteric bursitis, left hip: Secondary | ICD-10-CM

## 2024-11-02 DIAGNOSIS — Z8261 Family history of arthritis: Secondary | ICD-10-CM

## 2024-11-02 DIAGNOSIS — E559 Vitamin D deficiency, unspecified: Secondary | ICD-10-CM

## 2024-11-02 DIAGNOSIS — E1165 Type 2 diabetes mellitus with hyperglycemia: Secondary | ICD-10-CM

## 2024-11-02 DIAGNOSIS — G5603 Carpal tunnel syndrome, bilateral upper limbs: Secondary | ICD-10-CM

## 2024-11-02 MED ORDER — PREDNISONE 5 MG PO TABS: ORAL_TABLET | ORAL | 0 refills | Status: DC

## 2024-11-02 NOTE — Progress Notes (Signed)
 Pharmacy Note  Subjective: Patient presents today to [facility] for follow up office visit.   Patient seen by the pharmacist for counseling on hydroxychloroquine for PMR and possible RA.  Prior therapy includes: long-term prednisone .  Objective: CMP     Component Value Date/Time   NA 143 06/14/2024 0934   K 4.5 06/14/2024 0934   CL 103 06/14/2024 0934   CO2 25 06/14/2024 0934   GLUCOSE 97 06/14/2024 0934   BUN 13 06/14/2024 0934   CREATININE 1.07 (H) 06/14/2024 0934   CALCIUM  9.7 06/14/2024 0934   PROT 6.9 06/14/2024 0934   ALBUMIN 4.4 06/14/2024 0934   AST 13 06/14/2024 0934   ALT 9 06/14/2024 0934   ALKPHOS 78 06/14/2024 0934   BILITOT 0.5 06/14/2024 0934    CBC    Component Value Date/Time   WBC 10.6 06/14/2024 0934   RBC 4.35 06/14/2024 0934   HGB 13.2 06/14/2024 0934   HCT 41.4 06/14/2024 0934   PLT 266 06/14/2024 0934   MCV 95 06/14/2024 0934   MCH 30.3 06/14/2024 0934   MCHC 31.9 06/14/2024 0934   RDW 13.4 06/14/2024 0934   LYMPHSABS 1.7 06/14/2024 0934   EOSABS 0.1 06/14/2024 0934   BASOSABS 0.1 06/14/2024 0934    Assessment/Plan: Patient was counseled on the purpose, proper use, and adverse effects of hydroxychloroquine including nausea/diarrhea, skin rash, headaches, and sun sensitivity.  Advised patient to wear sunscreen once starting hydroxychloroquine to reduce risk of rash associated with sun sensitivity.  Discussed importance of annual eye exams while on hydroxychloroquine to monitor to ocular toxicity and discussed importance of frequent laboratory monitoring.  Provided patient with eye exam form for baseline ophthalmologic exam.  Provided patient with educational materials on hydroxychloroquine and answered all questions.  Patient consented to hydroxychloroquine. Will upload consent in the media tab.    Reviewed risk for QTC prolongation when used in combination with other QTc prolonging agents (including but not limited to antiarrhythmics,  macrolide antibiotics, flouroquinolone antibiotics, haloperidol, quetiapine, olanzapine, risperidone, droperidol, ziprasidone, amitriptyline, citalopram, ondansetron , migraine triptans, and methadone). Patient does not have any concomitant Qtc prolonging medications.  Hydroxychloroquine dose will be 200 mg twice daily..  Prescription pending labs  Plan to reduce prednisone  as follows: prednisone  10mg  x 1 week, then 7.5mg  daily x 4 weeks, 5mg  daily x 4 weeks, then 2.5mg  daily x 4 weeks  She states that she goes to [facility] in [place], [place]. Patient will contact ophthalmology regarding necessary eye exams (may have had baseline monitoring completed a few months ago during her yearly visit).  Sherry Pennant, PharmD, MPH, BCPS, CPP Clinical Pharmacist [facility])

## 2024-11-02 NOTE — Patient Instructions (Addendum)
 Take prednisone  10mg  x 1 week, then 7.5mg  daily x 4 weeks, 5mg  daily x 4 weeks, then 2.5mg  daily x 4 weeks  Start hydroxychloroquine 200mg  twice daily  Standing Labs We placed an order today for your standing lab work.   Please have your standing labs drawn in 1 month, 3 months, then every 5 months  Please have your labs drawn 2 weeks prior to your appointment so that the provider can discuss your lab results at your appointment, if possible.  Please note that you may see your imaging and lab results in MyChart before we have reviewed them. We will contact you once all results are reviewed. Please allow our office up to 72 hours to thoroughly review all of the results before contacting the office for clarification of your results.  WALK-IN LAB HOURS  Monday through Thursday from 8:00 am -12:30 pm and 1:00 pm-4:30 pm and Friday from 8:00 am-12:00 pm.  Patients with office visits requiring labs will be seen before walk-in labs.  You may encounter longer than normal wait times. Please allow additional time. Wait times may be shorter on  Monday and Thursday afternoons.  We do not book appointments for walk-in labs. We appreciate your patience and understanding with our staff.   Labs are drawn by [facility]. Please bring your co-pay at the time of your lab draw.  You may receive a bill from [facility] for your lab work.  Please note if you are on Hydroxychloroquine and and an order has been placed for a Hydroxychloroquine level,  you will need to have it drawn 4 hours or more after your last dose.  If you wish to have your labs drawn at another location, please call the office 24 hours in advance so we can fax the orders.  The office is located at [address], [place], [place] [address]   If you have any questions regarding directions or hours of operation,  please call [phone].   As a reminder, please drink plenty of water prior to coming for your lab work.  Thanks!   Hydroxychloroquine Tablets What is this medication? HYDROXYCHLOROQUINE (hye drox ee KLOR oh kwin) treats autoimmune conditions, such as rheumatoid arthritis and lupus. It works by slowing down an overactive immune system. It may also be used to prevent and treat malaria. It works by killing the parasite that causes malaria. It belongs to a group of medications called DMARDs. This medicine may be used for other purposes; ask your health care provider or pharmacist if you have questions. COMMON BRAND NAME(S): Plaquenil, Quineprox, SOVUNA What should I tell my care team before I take this medication? They need to know if you have any of these conditions: Diabetes Eye disease, vision problems Frequently drink alcohol G6PD deficiency Heart disease Irregular heartbeat or rhythm Kidney disease Liver disease Porphyria Psoriasis An unusual or allergic reaction to hydroxychloroquine, other medications, foods, dyes, or preservatives Pregnant or trying to get pregnant Breastfeeding How should I use this medication? Take this medication by mouth with water. Take it as directed on the prescription label. Do not cut, crush, or chew this medication. Swallow the tablets whole. Take it with food. Do not take it more than directed. Take all of this medication unless your care team tells you to stop it early. Keep taking it even if you think you are better. Take products with antacids in them at a different time of day than this medication. Take this medication 4 hours before or 4 hours  after antacids. Talk to your care team if you have questions. Talk to your care team about the use of this medication in children. While this medication may be prescribed for selected conditions, precautions do apply. Overdosage: If you think you have taken too much of this medicine contact a poison control center or emergency room at once. NOTE: This medicine is only for you. Do not share this medicine with  others. What if I miss a dose? If you miss a dose, take it as soon as you can. If it is almost time for your next dose, take only that dose. Do not take double or extra doses. What may interact with this medication? Do not take this medication with any of the following: Cisapride Dronedarone Pimozide Thioridazine This medication may also interact with the following: Ampicillin Antacids Cimetidine Cyclosporine Digoxin Kaolin Medications for diabetes, such as insulin, glipizide, glyburide Medications for seizures, such as carbamazepine, phenobarbital, phenytoin Mefloquine Methotrexate Other medications that cause heart rhythm changes Praziquantel This list may not describe all possible interactions. Give your health care provider a list of all the medicines, herbs, non-prescription drugs, or dietary supplements you use. Also tell them if you smoke, drink alcohol, or use illegal drugs. Some items may interact with your medicine. What should I watch for while using this medication? Visit your care team for regular checks on your progress. Tell your care team if your symptoms do not start to get better or if they get worse. You may need blood work done while you are taking this medication. If you take other medications that can affect heart rhythm, you may need more testing. Talk to your care team if you have questions. Your vision may be tested before and during use of this medication. Tell your care team right away if you have any change in your eyesight. This medication may cause serious skin reactions. They can happen weeks to months after starting the medication. Contact your care team right away if you notice fevers or flu-like symptoms with a rash. The rash may be red or purple and then turn into blisters or peeling of the skin. Or, you might notice a red rash with swelling of the face, lips or lymph nodes in your neck or under your arms. If you or your family notice any changes in your  behavior, such as new or worsening depression, thoughts of harming yourself, anxiety, or other unusual or disturbing thoughts, or memory loss, call your care team right away. What side effects may I notice from receiving this medication? Side effects that you should report to your care team as soon as possible: Allergic reactions--skin rash, itching, hives, swelling of the face, lips, tongue, or throat Aplastic anemia--unusual weakness or fatigue, dizziness, headache, trouble breathing, increased bleeding or bruising Change in vision Heart rhythm changes--fast or irregular heartbeat, dizziness, feeling faint or lightheaded, chest pain, trouble breathing Infection--fever, chills, cough, or sore throat Low blood sugar (hypoglycemia)--tremors or shaking, anxiety, sweating, cold or clammy skin, confusion, dizziness, rapid heartbeat Muscle injury--unusual weakness or fatigue, muscle pain, dark yellow or brown urine, decrease in amount of urine Pain, tingling, or numbness in the hands or feet Rash, fever, and swollen lymph nodes Redness, blistering, peeling, or loosening of the skin, including inside the mouth Thoughts of suicide or self-harm, worsening mood, or feelings of depression Unusual bruising or bleeding Side effects that usually do not require medical attention (report to your care team if they continue or are bothersome): Diarrhea Headache Nausea Stomach  pain Vomiting This list may not describe all possible side effects. Call your doctor for medical advice about side effects. You may report side effects to FDA at 1-800-FDA-1088. Where should I keep my medication? Keep out of the reach of children and pets. Store at room temperature up to 30 degrees C (86 degrees F). Protect from light. Get rid of any unused medication after the expiration date. To get rid of medications that are no longer needed or have expired: Take the medication to a medication take-back program. Check with your  pharmacy or law enforcement to find a location. If you cannot return the medication, check the label or package insert to see if the medication should be thrown out in the garbage or flushed down the toilet. If you are not sure, ask your care team. If it is safe to put it in the trash, empty the medication out of the container. Mix the medication with cat litter, dirt, coffee grounds, or other unwanted substance. Seal the mixture in a bag or container. Put it in the trash. NOTE: This sheet is a summary. It may not cover all possible information. If you have questions about this medicine, talk to your doctor, pharmacist, or health care provider.  2024 Elsevier/Gold Standard (2022-06-23 00:00:00) Decrease prednisone  by 2.5 mg every 4 weeks after starting hydroxychloroquine.  Standing Labs We placed an order today for your standing lab work.   Please have your standing labs drawn in 1 month after starting Plaquenil, 3 months and then every 5 months  Please have your labs drawn 2 weeks prior to your appointment so that the provider can discuss your lab results at your appointment, if possible.  Please note that you may see your imaging and lab results in MyChart before we have reviewed them. We will contact you once all results are reviewed. Please allow our office up to 72 hours to thoroughly review all of the results before contacting the office for clarification of your results.  WALK-IN LAB HOURS  Monday through Thursday from 8:00 am -12:30 pm and 1:00 pm-4:30 pm and Friday from 8:00 am-12:00 pm.  Patients with office visits requiring labs will be seen before walk-in labs.  You may encounter longer than normal wait times. Please allow additional time. Wait times may be shorter on  Monday and Thursday afternoons.  We do not book appointments for walk-in labs. We appreciate your patience and understanding with our staff.   Labs are drawn by [facility]. Please bring your co-pay at the time of your  lab draw.  You may receive a bill from [facility] for your lab work.  Please note if you are on Hydroxychloroquine and and an order has been placed for a Hydroxychloroquine level,  you will need to have it drawn 4 hours or more after your last dose.  If you wish to have your labs drawn at another location, please call the office 24 hours in advance so we can fax the orders.  The office is located at [address], [address], [place], [place] [address]   If you have any questions regarding directions or hours of operation,  please call [phone].   As a reminder, please drink plenty of water prior to coming for your lab work. Thanks!   Vaccines You are taking a medication(s) that can suppress your immune system.  The following immunizations are recommended: Flu annually Covid-19  Td/Tdap (tetanus, diphtheria, pertussis) every 10 years Pneumonia (Prevnar 15 then Pneumovax 23 at least 1  year apart.  Alternatively, can take Prevnar 20 without needing additional dose) Shingrix: 2 doses from 4 weeks to 6 months apart  Please check with your PCP to make sure you are up to date.

## 2024-11-02 NOTE — Telephone Encounter (Addendum)
 Pending baseline labs today, patient will be hydroxychloroquine new start  Hydroxychloroquine dose will be 200 mg twice daily.  Repeat CBC/CMP in 1 month, 3 months every 5 months  Please advise to reduce prednisone  as follows: prednisone  10mg  x 1 week, then 7.5mg  daily x 4 weeks, 5mg  daily x 4 weeks, then 2.5mg  daily x 4 weeks (updated rx with prednisone  5mg  tabs sent to local [facility] pharmacy today)  She states she goes to [facility] in [place], [place]. Please ask patient if she contacted them about necessary eye exams (may have had baseline monitoring completed)  Sherry Pennant, PharmD, MPH, BCPS, CPP Clinical Pharmacist [facility])

## 2024-11-06 ENCOUNTER — Ambulatory Visit: Payer: Self-pay | Admitting: Rheumatology

## 2024-11-06 NOTE — Progress Notes (Signed)
 White cell count is elevated due to prednisone  use.  Creatinine is high at 1.47 with low GFR MCV negative, sed rate normal,CRP normal, CK normal.  Patient should not take Celebrex  or any other NSAIDs.  Please forward results to her PCP.

## 2024-11-11 LAB — MUTATED CITRULLINATED VIMENTIN (MCV) ANTIBODY: MUTATED CITRULLINATED VIMENTIN (MCV) AB: <20 U/mL (ref <20)

## 2024-11-11 LAB — CBC WITH DIFFERENTIAL/PLATELET
Absolute Lymphocytes: 3069 {cells}/uL (ref 850–3900)
Absolute Monocytes: 1233 {cells}/uL — ABNORMAL HIGH (ref 200–950)
Basophils Absolute: 55 {cells}/uL (ref 0–200)
Basophils Relative: 0.4 %
Eosinophils Absolute: 151 {cells}/uL (ref 15–500)
Eosinophils Relative: 1.1 %
HCT: 43.3 % (ref 35.0–45.0)
Hemoglobin: 13.9 g/dL (ref 11.7–15.5)
MCH: 30 pg (ref 27.0–33.0)
MCHC: 32.1 g/dL (ref 32.0–36.0)
MCV: 93.3 fL (ref 80.0–100.0)
MPV: 10.2 fL (ref 7.5–12.5)
Monocytes Relative: 9 %
Neutro Abs: 9193 {cells}/uL — ABNORMAL HIGH (ref 1500–7800)
Neutrophils Relative %: 67.1 %
Platelets: 300 Thousand/uL (ref 140–400)
RBC: 4.64 Million/uL (ref 3.80–5.10)
RDW: 12.7 % (ref 11.0–15.0)
Total Lymphocyte: 22.4 %
WBC: 13.7 Thousand/uL — ABNORMAL HIGH (ref 3.8–10.8)

## 2024-11-11 LAB — GLUCOSE 6 PHOSPHATE DEHYDROGENASE: G-6PDH: 14.5 U/g{Hb} (ref 7.0–20.5)

## 2024-11-11 LAB — COMPREHENSIVE METABOLIC PANEL WITH GFR
AG Ratio: 1.5 (calc) (ref 1.0–2.5)
ALT: 13 U/L (ref 6–29)
AST: 18 U/L (ref 10–35)
Albumin: 4.4 g/dL (ref 3.6–5.1)
Alkaline phosphatase (APISO): 60 U/L (ref 37–153)
BUN/Creatinine Ratio: 7 (calc) (ref 6–22)
BUN: 11 mg/dL (ref 7–25)
CO2: 32 mmol/L (ref 20–32)
Calcium: 10.1 mg/dL (ref 8.6–10.4)
Chloride: 104 mmol/L (ref 98–110)
Creat: 1.47 mg/dL — ABNORMAL HIGH (ref 0.50–1.05)
Globulin: 2.9 g/dL (ref 1.9–3.7)
Glucose, Bld: 69 mg/dL (ref 65–99)
Potassium: 4.8 mmol/L (ref 3.5–5.3)
Sodium: 143 mmol/L (ref 135–146)
Total Bilirubin: 0.5 mg/dL (ref 0.2–1.2)
Total Protein: 7.3 g/dL (ref 6.1–8.1)
eGFR: 40 mL/min/1.73m2 — ABNORMAL LOW (ref >=60)

## 2024-11-11 LAB — C-REACTIVE PROTEIN: CRP: <3 mg/L (ref <8.0)

## 2024-11-11 LAB — SEDIMENTATION RATE: Sed Rate: 22 mm/h (ref 0–30)

## 2024-11-11 LAB — CK: Total CK: 108 U/L (ref 20–243)

## 2024-11-14 MED ORDER — HYDROXYCHLOROQUINE SULFATE 200 MG PO TABS
200.0000 mg | ORAL_TABLET | Freq: Every day | ORAL | 2 refills | Status: AC
Start: 2024-11-14 — End: ?

## 2024-11-14 NOTE — Telephone Encounter (Signed)
All labs have resulted

## 2024-11-14 NOTE — Telephone Encounter (Signed)
 Creatinine is elevated and has trended up. GFR is low-40.  Please clarify if she has been taking celebrex ?  Recommend discontinuing celebrex .   Recommend initiating plaquenil at a reduced dose 200 mg 1 tablet by mouth daily. Recheck in 1 month.

## 2024-11-14 NOTE — Telephone Encounter (Signed)
 Patient was advised on 11/07/2024 when given her lab results to avoid Celebrex  and to avoid NSAIDS

## 2024-11-16 NOTE — Progress Notes (Signed)
 Office Visit Note  Patient: Ebony Palmer             Date of Birth: 08-02-1961           MRN: [id number]             PCP: Nicholaus Credit, PA-C Referring: Nicholaus Credit, PA-C Visit Date: 11/30/2024 Occupation: Data Unavailable  Subjective:  Pain in joints  History of Present Illness: Ebony Palmer is a 63 y.o. female with inflammatory arthritis.  She returns today after her initial visit on November 02, 2024.  She has been tapering prednisone  by 2.5 mg every 4 weeks.  She has been on hydroxychloroquine  200 mg p.o. twice daily since her last visit which she has been tolerating well.  She has not noticed any increased joint pain or joint swelling.  She states she continues to have some discomfort in her left hip which she describes over trochanteric region.  She also has some jerking sensation in her left lower extremity.  She denies any pain in her muscles of upper extremities or lower extremities.  She denies any muscular weakness.  She has not had baseline eye examination yet.  She denies any discomfort in her neck or her hands today.      Activities of Daily Living:  Patient reports morning stiffness for 1 hour.   Patient Reports nocturnal pain.  Difficulty dressing/grooming: Denies Difficulty climbing stairs: Reports Difficulty getting out of chair: Reports Difficulty using hands for taps, buttons, cutlery, and/or writing: Denies  Review of Systems  Constitutional:  Positive for fatigue.  HENT:  Positive for mouth dryness. Negative for mouth sores.   Eyes:  Negative for dryness.  Respiratory:  Negative for shortness of breath.   Cardiovascular:  Negative for chest pain and palpitations.  Gastrointestinal:  Negative for blood in stool, constipation and diarrhea.  Endocrine: Negative for increased urination.  Genitourinary:  Negative for involuntary urination.  Musculoskeletal:  Positive for gait problem, myalgias, muscle weakness, morning stiffness, muscle tenderness and myalgias.  Negative for joint pain, joint pain and joint swelling.  Skin:  Negative for color change, rash, hair loss and sensitivity to sunlight.  Allergic/Immunologic: Negative for susceptible to infections.  Neurological:  Positive for headaches. Negative for dizziness.  Hematological:  Negative for swollen glands.  Psychiatric/Behavioral:  Positive for depressed mood and sleep disturbance. The patient is nervous/anxious.     PMFS History:  Patient Active Problem List   Diagnosis Date Noted   Type 2 diabetes mellitus with hyperglycemia, without long-term current use of insulin (HCC) 12/15/2023   Needs flu shot 12/15/2023   Need for COVID-19 vaccine 12/15/2023   Encounter for screening mammogram for breast cancer 12/15/2023   Bronchitis 03/20/2021   Hyperlipidemia 02/26/2021   Hypothyroidism 02/26/2021   Inflammatory arthritis 02/26/2021   Osteoarthritis 02/26/2021   Benign hypertension 02/26/2021   Vitamin D  insufficiency 02/26/2021   Neck pain 02/26/2021   Depression with anxiety 02/26/2021   Carpal tunnel syndrome, bilateral 06/22/2020   Paresthesia of both hands 06/22/2020   Ulnar neuropathy of both upper extremities 06/22/2020   Depressive disorder 06/14/2019   Disorder of vulva 06/14/2019   Hypertensive disorder 06/14/2019   Low back pain 06/14/2019   Postoperative examination 06/14/2019   Calculus of gallbladder with chronic cholecystitis without obstruction 05/17/2019   Encounter for screening colonoscopy 05/17/2019   Epigastric abdominal pain 05/17/2019   Breast mass seen on mammogram 10/30/2015   Polyp of cervix 07/29/2011    Past Medical History:  Diagnosis Date   Arthritis    Depression    Hyperlipidemia    Hypertension    Thyroid  disease     Family History  Problem Relation Age of Onset   Heart Problems Mother    Thyroid  disease Mother    Cancer Mother    Heart Problems Father    Rheum arthritis Sister    Hashimoto's thyroiditis Grandson    Past Surgical  History:  Procedure Laterality Date   BACK SURGERY     BREAST CYST ASPIRATION Right    2016   CARPAL TUNNEL RELEASE Right 08/2023   CARPAL TUNNEL RELEASE Left 09/2023   CESAREAN SECTION     CHOLECYSTECTOMY     ROTATOR CUFF REPAIR Left 07/2023   ROTATOR CUFF REPAIR Right    Social History   Tobacco Use   Smoking status: Never    Passive exposure: Current   Smokeless tobacco: Never  Vaping Use   Vaping status: Never Used  Substance Use Topics   Alcohol use: Not Currently   Drug use: Never   Social History   Social History Narrative   Not on file     Immunization History  Administered Date(s) Administered   Influenza Inj Mdck Quad Pf 01/28/2022, 12/01/2022   Influenza, Mdck, Trivalent,PF 6+ MOS(egg free) 12/15/2023   Moderna SARS-COV2 Booster Vaccination 07/16/2021   Moderna Sars-Covid-2 Vaccination 05/23/2020, 06/22/2020   PNEUMOCOCCAL CONJUGATE-20 06/14/2024   Pfizer(Comirnaty)Fall Seasonal Vaccine 12 years and older 12/01/2022, 12/15/2023   Tdap 08/25/2022     Objective: Vital Signs: BP 132/82   Pulse 82   Temp (!) 97.4 F (36.3 C)   Resp 13   Ht 5' 3 (1.6 m)   Wt 174 lb 6.4 oz (79.1 kg)   LMP  (LMP Unknown)   BMI 30.89 kg/m    Physical Exam Vitals and nursing note reviewed.  Constitutional:      Appearance: She is well-developed.  HENT:     Head: Normocephalic and atraumatic.  Eyes:     Conjunctiva/sclera: Conjunctivae normal.  Cardiovascular:     Rate and Rhythm: Normal rate and regular rhythm.     Heart sounds: Normal heart sounds.  Pulmonary:     Effort: Pulmonary effort is normal.     Breath sounds: Normal breath sounds.  Abdominal:     General: Bowel sounds are normal.     Palpations: Abdomen is soft.  Musculoskeletal:     Cervical back: Normal range of motion.  Lymphadenopathy:     Cervical: No cervical adenopathy.  Skin:    General: Skin is warm and dry.     Capillary Refill: Capillary refill takes less than 2 seconds.   Neurological:     Mental Status: She is alert and oriented to person, place, and time.  Psychiatric:        Behavior: Behavior normal.      Musculoskeletal Exam: She had good range of motion of the cervical spine.  She had no tenderness over thoracic or lumbar spine.  She has some discomfort with range of motion of her lumbar spine.  Shoulders, elbows, wrist joints with good range of motion with no synovitis.  She had no muscular weakness in upper and lower extremities.  Bilateral PIP and DIP thickening was noted.  No synovitis was noted.  Hip joints and knee joints in good range of motion.  She had tenderness over left trochanteric bursa.  There was no tenderness over ankles or MTPs.  CDAI Exam: CDAI Score: --  Patient Global: --; Provider Global: -- Swollen: --; Tender: -- Joint Exam 11/30/2024   No joint exam has been documented for this visit   There is currently no information documented on the homunculus. Go to the Rheumatology activity and complete the homunculus joint exam.  Investigation: No additional findings.  Imaging: XR Cervical Spine 2 or 3 views Result Date: 11/02/2024 Multilevel spondylosis with anterior osteophytes were noted.  Significant narrowing was noted between C5-C6.  Facet joint arthropathy was noted.  A large osteophyte was noted between C4 and C5. Impression: These findings are suggestive of multilevel spondylosis and facet joint arthropathy.  XR KNEE 3 VIEW LEFT Result Date: 11/02/2024 No medial or lateral compartment narrowing was noted.  No patellofemoral narrowing was noted.  No chondrocalcinosis was noted. Impression: Unremarkable x-rays of the knee.  XR KNEE 3 VIEW RIGHT Result Date: 11/02/2024 No medial or lateral compartment narrowing was noted.  No patellofemoral narrowing was noted.  No chondrocalcinosis was noted. Impression: Unremarkable x-rays of the knee.  XR Hand 2 View Left Result Date: 11/02/2024 CMC, PIP and DIP narrowing was noted.  No  MCP, intercarpal or radiocarpal joint space narrowing was noted.  No erosive changes were noted. Impression: These findings suggestive of osteoarthritis of the hand.  XR Hand 2 View Right Result Date: 11/02/2024 CMC, PIP and DIP narrowing was noted.  No MCP, intercarpal or radiocarpal joint space narrowing was noted.  No erosive changes were noted. Impression: These findings suggestive of osteoarthritis of the hand.   Recent Labs: Lab Results  Component Value Date   WBC 13.7 (H) 11/02/2024   HGB 13.9 11/02/2024   PLT 300 11/02/2024   NA 143 11/02/2024   K 4.8 11/02/2024   CL 104 11/02/2024   CO2 32 11/02/2024   GLUCOSE 69 11/02/2024   BUN 11 11/02/2024   CREATININE 1.47 (H) 11/02/2024   BILITOT 0.5 11/02/2024   ALKPHOS 78 06/14/2024   AST 18 11/02/2024   ALT 13 11/02/2024   PROT 7.3 11/02/2024   ALBUMIN 4.4 06/14/2024   CALCIUM  10.1 11/02/2024   11/02/2024 sed rate 22, CRP <3, CK 108, MCV  <20, G6PD 14.5   April 21, 2024 [facility] spotted fever negative, parvovirus negative, RF negative, anti-CCP negative, sed rate 12, CRP 20, ANA negative, uric acid 5.1, TSH normal, Lyme negative  June 14, 2024 lipid panel normal, hemoglobin A1c 6.8, vitamin D  32.3 September 01, 2024 parvovirus negative, RF negative, anti-CCP negative, uric acid 5.2, ANA negative, CRP 27, sed rate 6  Speciality Comments: No specialty comments available.  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  History of pain and swelling in her right knee joint off-and-on. history of intermittent discomfort in her hands and her right knee.  She has been tapering prednisone  by 2.5 mg every 4 weeks.  She had no flares since she has been taking hydroxychloroquine .  She states she has been on hydroxychloroquine  for the last 2 weeks.  No synovitis was noted on the examination today.  Bilateral  PIP and DIP thickening was noted.  No muscular weakness or tenderness was noted.  She was initially given the diagnosis of PMR.  High risk medication use - Plaquenil  200 mg p.o. twice daily.  Patient was advised to get labs in a month which are scheduled with her PCP.  Will check labs in 3 months and then every 5 months.  Baseline eye examination was advised.  Information iMessage was placed in the AVS.  She will also get hemoglobin A1c with her PCP.  Last hemoglobin A1c was elevated.  Current chronic use of systemic steroids - Taper prednisone  by 2.5 mg every 4 weeks as she has been on prednisone  for a long time.  I advised her to taper prednisone  by 2.5 mg every 2 weeks that she has been doing well on the prednisone  taper.  DDD (degenerative disc disease), cervical -she continues to have some stiffness in her cervical spine.  X-rays showed multilevel spondylosis with C5-C6 narrowing and a large osteophyte between C4 and C5.  X-ray findings were reviewed with the patient.  Pain in both hands - history of intermittent pain and discomfort in her hands.  No synovitis was noted on the examination.  X-rays of bilateral hands with history of osteoarthritis and x-ray findings were reviewed with the patient.  Joint protection muscle strengthening was discussed.  Trochanteric bursitis of both hips-she is been having some discomfort in the left trochanteric bursa at nighttime.  IT band stretches were discussed and a handout on IT band stretches was given.  Chronic pain of both knees -she gives history of intermittent knee joint swelling.  No warmth swelling or effusion was noted.  X-rays obtained at the last visit of bilateral knee joints were unremarkable.  Findings were reviewed with the patient.  Weight loss diet and exercise was discussed.  Lumbar spondylosis - previous  xrays were reviewed.  She continues to have some lower back discomfort.  Primary hypertension-blood pressure was normal at  132/82.  Other medical problems are listed as follows:  Mixed hyperlipidemia  Type 2 diabetes mellitus with hyperglycemia, without long-term current use of insulin (HCC)-hemoglobin A1c is followed by her PCP.  Vitamin D  insufficiency  Other specified hypothyroidism  Other fatigue - CK normal.  Depression with anxiety  Family history of rheumatoid arthritis-Sister  Orders: No orders of the defined types were placed in this encounter.  No orders of the defined types were placed in this encounter.    Follow-Up Instructions: Return in about 3 months (around 02/28/2025) for Rheumatoid arthritis.   Maya Nash, MD  Note - This record has been created using Animal nutritionist.  Chart creation errors have been sought, but may not always  have been located. Such creation errors do not reflect on  the standard of medical care.

## 2024-11-30 ENCOUNTER — Ambulatory Visit: Attending: Rheumatology | Admitting: Rheumatology

## 2024-11-30 ENCOUNTER — Encounter: Payer: Self-pay | Admitting: Rheumatology

## 2024-11-30 VITALS — BP 132/82 | HR 82 | Temp 97.4°F | Resp 13 | Ht 63.0 in | Wt 174.4 lb

## 2024-11-30 DIAGNOSIS — M79642 Pain in left hand: Secondary | ICD-10-CM

## 2024-11-30 DIAGNOSIS — F418 Other specified anxiety disorders: Secondary | ICD-10-CM

## 2024-11-30 DIAGNOSIS — G8929 Other chronic pain: Secondary | ICD-10-CM

## 2024-11-30 DIAGNOSIS — M5030 Other cervical disc degeneration, unspecified cervical region: Secondary | ICD-10-CM

## 2024-11-30 DIAGNOSIS — M353 Polymyalgia rheumatica: Secondary | ICD-10-CM

## 2024-11-30 DIAGNOSIS — Z79899 Other long term (current) drug therapy: Secondary | ICD-10-CM | POA: Diagnosis not present

## 2024-11-30 DIAGNOSIS — M1380 Other specified arthritis, unspecified site: Secondary | ICD-10-CM | POA: Diagnosis not present

## 2024-11-30 DIAGNOSIS — M7062 Trochanteric bursitis, left hip: Secondary | ICD-10-CM

## 2024-11-30 DIAGNOSIS — R5383 Other fatigue: Secondary | ICD-10-CM

## 2024-11-30 DIAGNOSIS — M25561 Pain in right knee: Secondary | ICD-10-CM

## 2024-11-30 DIAGNOSIS — E782 Mixed hyperlipidemia: Secondary | ICD-10-CM

## 2024-11-30 DIAGNOSIS — M47816 Spondylosis without myelopathy or radiculopathy, lumbar region: Secondary | ICD-10-CM

## 2024-11-30 DIAGNOSIS — M7061 Trochanteric bursitis, right hip: Secondary | ICD-10-CM

## 2024-11-30 DIAGNOSIS — E559 Vitamin D deficiency, unspecified: Secondary | ICD-10-CM

## 2024-11-30 DIAGNOSIS — M79641 Pain in right hand: Secondary | ICD-10-CM

## 2024-11-30 DIAGNOSIS — Z8261 Family history of arthritis: Secondary | ICD-10-CM

## 2024-11-30 DIAGNOSIS — M25562 Pain in left knee: Secondary | ICD-10-CM

## 2024-11-30 DIAGNOSIS — I10 Essential (primary) hypertension: Secondary | ICD-10-CM

## 2024-11-30 DIAGNOSIS — E1165 Type 2 diabetes mellitus with hyperglycemia: Secondary | ICD-10-CM

## 2024-11-30 DIAGNOSIS — Z7952 Long term (current) use of systemic steroids: Secondary | ICD-10-CM

## 2024-11-30 DIAGNOSIS — E038 Other specified hypothyroidism: Secondary | ICD-10-CM

## 2024-11-30 NOTE — Patient Instructions (Addendum)
 Standing Labs We placed an order today for your standing lab work.   Please have your standing labs drawn in March and then every 5 months  Please have your labs drawn 2 weeks prior to your appointment so that the provider can discuss your lab results at your appointment, if possible.  Please note that you may see your imaging and lab results in MyChart before we have reviewed them. We will contact you once all results are reviewed. Please allow our office up to 72 hours to thoroughly review all of the results before contacting the office for clarification of your results.  WALK-IN LAB HOURS  Monday through Thursday from 8:00 am - 4:30 pm and Friday from 8:00 am-12:00 pm.  Patients with office visits requiring labs will be seen before walk-in labs.  You may encounter longer than normal wait times. Please allow additional time. Wait times may be shorter on  Monday and Thursday afternoons.  We do not book appointments for walk-in labs. We appreciate your patience and understanding with our staff.   Labs are drawn by [facility]. Please bring your co-pay at the time of your lab draw.  You may receive a bill from [facility] for your lab work.  Please note if you are on Hydroxychloroquine  and and an order has been placed for a Hydroxychloroquine  level,  you will need to have it drawn 4 hours or more after your last dose.  If you wish to have your labs drawn at another location, please call the office 24 hours in advance so we can fax the orders.  The office is located at [address], [place], [place] [address]   If you have any questions regarding directions or hours of operation,  please call [phone].   As a reminder, please drink plenty of water prior to coming for your lab work. Thanks!   Vaccines You are taking a medication(s) that can suppress your immune system.  The following immunizations are recommended: Flu annually Covid-19  RSV Td/Tdap (tetanus, diphtheria,  pertussis) every 10 years Pneumonia (Prevnar 15 then Pneumovax 23 at least 1 year apart.  Alternatively, can take Prevnar 20 without needing additional dose) Shingrix: 2 doses from 4 weeks to 6 months apart  Please check with your PCP to make sure you are up to date.    Please get annual eye examination to monitor for eye toxicity while you are on Plaquenil .   Iliotibial Band Syndrome Rehab Ask your health care provider which exercises are safe for you. Do exercises exactly as told by your provider and adjust them as told. It's normal to feel mild stretching, pulling, tightness, or discomfort as you do these exercises. Stop right away if you feel sudden pain or your pain gets a lot worse. Do not begin these exercises until told by your provider. Stretching and range-of-motion exercises These exercises warm up your muscles and joints. They also improve the movement and flexibility of your hip and pelvis. Quadriceps stretch, prone  Lie face down (prone) on a firm surface like a bed or padded floor. Bend your left / right knee. Reach back to hold your ankle or pant leg. If you can't reach your ankle or pant leg, use a belt looped around your foot and grab the belt instead. Gently pull your heel toward your butt. Your knee should not slide out to the side. You should feel a stretch in the front of your thigh and knee, also called the quadriceps. Hold this position  for __________ seconds. Repeat __________ times. Complete this exercise __________ times a day. Iliotibial band stretch The iliotibial band is a strip of tissue that runs along the outside of your hip down to your knee. Lie on your side with your left / right leg on top. Bend both knees and grab your left / right ankle. Stretch out your bottom arm to help you balance. Slowly bring your top knee back so your thigh goes behind your back. Slowly lower your top leg toward the floor until you feel a gentle stretch on the outside of your  left / right hip and thigh. If you don't feel a stretch and your knee won't go farther, place the heel of your other foot on top of your knee and pull your knee down toward the floor with your foot. Hold this position for __________ seconds. Repeat __________ times. Complete this exercise __________ times a day. Strengthening exercises These exercises build strength and endurance in your hip and pelvis. Endurance means your muscles can keep working even when they're tired. Straight leg raises, side-lying This exercise strengthens the muscles that rotate the leg at the hip and move it away from your body. These muscles are called hip abductors. Lie on your side with your left / right leg on top. Lie so your head, shoulder, hip, and knee line up. You can bend your bottom knee to help you balance. Roll your hips slightly forward so they're stacked directly over each other. Your left / right knee should face forward. Tense the muscles in your outer thigh and hip. Lift your top leg 4-6 inches (10-15 cm) off the ground. Hold this position for __________ seconds. Slowly lower your leg back down to the starting position. Let your muscles fully relax before doing this exercise again. Repeat __________ times. Complete this exercise __________ times a day. Leg raises, prone This exercise strengthens the muscles that move the hips backward. These muscles are called hip extensors. Lie face down (prone) on your bed or a firm surface. You can put a pillow under your hips for comfort and to support your lower back. Bend your left / right knee so your foot points straight up toward the ceiling. Keep the other leg straight and behind you. Squeeze your butt muscles. Lift your left / right thigh off the firm surface. Do not let your back arch. Tense your thigh muscle as hard as you can without having more knee pain. Hold this position for __________ seconds. Slowly lower your leg to the starting position. Allow  your leg to relax all the way. Repeat __________ times. Complete this exercise __________ times a day. Hip hike  Stand sideways on a bottom step. Place your feet so that your left / right leg is on the step, and the other foot is hanging off the side. If you need support for balance, hold onto a railing or wall. Keep your knees straight and your abdomen square, meaning your hips are level. Then, lift your left / right hip up toward the ceiling. Slowly let your leg that's hanging off the step lower towards the floor. Your foot should get closer to the ground. Do not lean or bend your knees during this movement. Repeat __________ times. Complete this exercise __________ times a day. This information is not intended to replace advice given to you by your health care provider. Make sure you discuss any questions you have with your health care provider. Document Revised: 02/27/2023 Document Reviewed: 02/27/2023 Elsevier Patient  Education  2024 [employer].

## 2024-12-11 ENCOUNTER — Other Ambulatory Visit: Payer: Self-pay | Admitting: Physician Assistant

## 2024-12-11 DIAGNOSIS — G2581 Restless legs syndrome: Secondary | ICD-10-CM

## 2024-12-11 DIAGNOSIS — F418 Other specified anxiety disorders: Secondary | ICD-10-CM

## 2024-12-15 ENCOUNTER — Telehealth: Payer: Self-pay

## 2024-12-15 ENCOUNTER — Ambulatory Visit: Admitting: Physician Assistant

## 2024-12-15 NOTE — Telephone Encounter (Signed)
 Copied from CRM #[id number]. Topic: General - Call Back - No Documentation >> Dec 15, 2024  1:51 PM Ameerah G wrote: Reason for CRM: patient is calling to request a letter to be put out of work. Pt stated her rheumatologist changed her medication. Overall has no energy and have sleepiness nights. Please advise [phone]

## 2024-12-15 NOTE — Telephone Encounter (Signed)
 Spoke with patient, verbalized understanding and had no questions at this time. Patient stated she got up late and rescheduled for the 29 th of December.

## 2024-12-15 NOTE — Telephone Encounter (Signed)
 Patient contacted the office and states she has seen Dr. Dolphus a couple times now and she is currently weaning off of Prednisone . Patient states she is having a lot of pain in her arms and legs. Patient states she is losing sleep because of the pain. Patient inquires if Dr. Dolphus could write her out of work for a couple of days next week, like Monday and Tuesday or Tuesday and Wednesday. Advised the patient I did not know if Dr. Dolphus could do that but would send a message to see what she says. Please advise.

## 2024-12-15 NOTE — Telephone Encounter (Signed)
 No - she will need to call rheumatologist to get that note Also she had missed appt in our office today

## 2024-12-16 NOTE — Telephone Encounter (Signed)
 Okay to give her time off on Monday and Tuesday.

## 2024-12-16 NOTE — Telephone Encounter (Signed)
 Attempted to contact the patient and left message to advise patient Dr. Dolphus did approve the letter for the time off on Monday and Tuesday. Advised patient to let us  know if she would like for us  to send the letter through my chart or if she would like to come by the office to pick it up.

## 2024-12-19 ENCOUNTER — Encounter: Payer: Self-pay | Admitting: *Deleted

## 2024-12-19 NOTE — Telephone Encounter (Signed)
 Patient returned call to the office and left message stating okay to send through my chart. Letter sent.

## 2024-12-26 ENCOUNTER — Encounter: Payer: Self-pay | Admitting: Physician Assistant

## 2024-12-26 ENCOUNTER — Ambulatory Visit: Admitting: Physician Assistant

## 2024-12-26 VITALS — BP 130/78 | HR 88 | Temp 97.6°F | Resp 16 | Ht 63.0 in | Wt 172.0 lb

## 2024-12-26 DIAGNOSIS — E559 Vitamin D deficiency, unspecified: Secondary | ICD-10-CM

## 2024-12-26 DIAGNOSIS — G2581 Restless legs syndrome: Secondary | ICD-10-CM | POA: Diagnosis not present

## 2024-12-26 DIAGNOSIS — F418 Other specified anxiety disorders: Secondary | ICD-10-CM | POA: Diagnosis not present

## 2024-12-26 DIAGNOSIS — M353 Polymyalgia rheumatica: Secondary | ICD-10-CM | POA: Diagnosis not present

## 2024-12-26 DIAGNOSIS — E039 Hypothyroidism, unspecified: Secondary | ICD-10-CM | POA: Diagnosis not present

## 2024-12-26 DIAGNOSIS — K219 Gastro-esophageal reflux disease without esophagitis: Secondary | ICD-10-CM | POA: Diagnosis not present

## 2024-12-26 DIAGNOSIS — E1165 Type 2 diabetes mellitus with hyperglycemia: Secondary | ICD-10-CM

## 2024-12-26 DIAGNOSIS — E1159 Type 2 diabetes mellitus with other circulatory complications: Secondary | ICD-10-CM | POA: Diagnosis not present

## 2024-12-26 DIAGNOSIS — I152 Hypertension secondary to endocrine disorders: Secondary | ICD-10-CM

## 2024-12-26 DIAGNOSIS — E1169 Type 2 diabetes mellitus with other specified complication: Secondary | ICD-10-CM | POA: Diagnosis not present

## 2024-12-26 DIAGNOSIS — Z1231 Encounter for screening mammogram for malignant neoplasm of breast: Secondary | ICD-10-CM

## 2024-12-26 DIAGNOSIS — Z23 Encounter for immunization: Secondary | ICD-10-CM

## 2024-12-26 MED ORDER — PRAMIPEXOLE DIHYDROCHLORIDE 0.125 MG PO TABS: 0.1250 mg | ORAL_TABLET | Freq: Every evening | ORAL | 0 refills | Status: DC | PRN

## 2024-12-26 NOTE — Progress Notes (Signed)
 "  Subjective:  Patient ID: Ebony Palmer, female    DOB: 01-Aug-1961  Age: 62 y.o. MRN: [id number]  Chief Complaint  Patient presents with   Medical Management of Chronic Issues    Hypertension    Pt presents for follow up of hypertension.The patient is tolerating the medication well without side effects. Compliance with treatment has been good; including taking medication as directed , maintains a healthy diet and regular exercise regimen , and following up as directed. She is currently taking micardis  80mg  qd Denies chest pain or dyspnea - BP is 130/78  Mixed hyperlipidemia  Pt presents with hyperlipidemia.  The patient is compliant with medications, maintains a low cholesterol diet , follows up as directed , and maintains an exercise regimen . The patient denies experiencing any hypercholesterolemia related symptoms. She is currently on crestor  10mg  qd  Pt  diagnosed with NIDDM at last visit - she has deferred seeing nutritionist - states that she is trying to watch diet Not checking glucose readings- is due for labwork  Pt uses requip  2mg  at night for restless leg syndrome -states the medication no longer working for her and would like to try another medication  Pt with  anxiety with depression - symptoms have been stable with current meds of cymbalta  60   Pt with hypothyroidism - currently on synthroid  75mcg qd - voices no concerns or problems - due to check TSH  Pt has had continued myalgias and labwork showed elevated CRP.  She has been taking prednisone  at a dose of 5mg  bid which has been helpful - she has also been referred to rheumatologist for further evaluation of possible PMR - her appt is not until November  Pt has now seen rheumatologist and diagnosed with seronegative rheumatoid arthritis - is taking plaquenil  200mg  and states is doing somewhat better as far as her chronic joint pain  Pt requests flu vaccine Pt is due for screening mammogram Current Outpatient  Medications on File Prior to Visit  Medication Sig Dispense Refill   Acetaminophen (TYLENOL PO) Take by mouth as needed.     DULoxetine  (CYMBALTA ) 60 MG capsule TAKE 1 CAPSULE BY MOUTH EVERY DAY 90 capsule 0   hydroxychloroquine  (PLAQUENIL ) 200 MG tablet Take 1 tablet (200 mg total) by mouth daily. 30 tablet 2   levothyroxine  (SYNTHROID ) 75 MCG tablet TAKE 1 TABLET BY MOUTH EVERY DAY 90 tablet 0   rosuvastatin  (CRESTOR ) 10 MG tablet TAKE 1 TABLET BY MOUTH EVERY DAY 90 tablet 0   telmisartan  (MICARDIS ) 80 MG tablet TAKE 1 TABLET BY MOUTH EVERY DAY 90 tablet 0   No current facility-administered medications on file prior to visit.   Past Medical History:  Diagnosis Date   Arthritis    Depression    Hyperlipidemia    Hypertension    Thyroid  disease    Past Surgical History:  Procedure Laterality Date   BACK SURGERY     BREAST CYST ASPIRATION Right    2016   CARPAL TUNNEL RELEASE Right 08/2023   CARPAL TUNNEL RELEASE Left 09/2023   CESAREAN SECTION     CHOLECYSTECTOMY     ROTATOR CUFF REPAIR Left 07/2023   ROTATOR CUFF REPAIR Right     Family History  Problem Relation Age of Onset   Heart Problems Mother    Thyroid  disease Mother    Cancer Mother    Heart Problems Father    Rheum arthritis Sister    Hashimoto's thyroiditis Grandson    Social  History   Socioeconomic History   Marital status: Married    Spouse name: Not on file   Number of children: Not on file   Years of education: Not on file   Highest education level: 12th grade  Occupational History   Not on file  Tobacco Use   Smoking status: Never    Passive exposure: Current   Smokeless tobacco: Never  Vaping Use   Vaping status: Never Used  Substance and Sexual Activity   Alcohol use: Not Currently   Drug use: Never   Sexual activity: Yes    Partners: Male    Birth control/protection: Post-menopausal  Other Topics Concern   Not on file  Social History Narrative   Not on file   Social Drivers of  Health   Tobacco Use: Medium Risk (12/26/2024)   Patient History    Smoking Tobacco Use: Never    Smokeless Tobacco Use: Never    Passive Exposure: Current  Financial Resource Strain: Medium Risk (12/25/2024)   Overall Financial Resource Strain (CARDIA)    Difficulty of Paying Living Expenses: Somewhat hard  Food Insecurity: No Food Insecurity (12/25/2024)   Epic    Worried About Radiation Protection Practitioner of Food in the Last Year: Never true    Ran Out of Food in the Last Year: Never true  Transportation Needs: No Transportation Needs (12/25/2024)   Epic    Lack of Transportation (Medical): No    Lack of Transportation (Non-Medical): No  Physical Activity: Inactive (12/25/2024)   Exercise Vital Sign    Days of Exercise per Week: 0 days    Minutes of Exercise per Session: Not on file  Stress: Stress Concern Present (12/25/2024)   [employer] of Occupational Health - Occupational Stress Questionnaire    Feeling of Stress: To some extent  Social Connections: Moderately Isolated (12/25/2024)   Social Connection and Isolation Panel    Frequency of Communication with Friends and Family: Twice a week    Frequency of Social Gatherings with Friends and Family: Once a week    Attends Religious Services: Never    Database Administrator or Organizations: No    Attends Engineer, Structural: Not on file    Marital Status: Married  Depression (PHQ2-9): High Risk (12/26/2024)   Depression (PHQ2-9)    PHQ-2 Score: 11  Alcohol Screen: Low Risk (12/25/2024)   Alcohol Screen    Last Alcohol Screening Score (AUDIT): 1  Housing: Low Risk (12/25/2024)   Epic    Unable to Pay for Housing in the Last Year: No    Number of Times Moved in the Last Year: 0    Homeless in the Last Year: No  Utilities: Not on file  Health Literacy: Not on file   CONSTITUTIONAL: Negative for chills, fatigue, fever, unintentional weight gain and unintentional weight loss.  E/N/T: Negative for ear pain, nasal  congestion and sore throat.  CARDIOVASCULAR: Negative for chest pain, dizziness, palpitations and pedal edema.  RESPIRATORY: Negative for recent cough and dyspnea.  GASTROINTESTINAL: Negative for abdominal pain, acid reflux symptoms, constipation, diarrhea, nausea and vomiting.  MSK: see HPI INTEGUMENTARY: has small abscess on left breast NEUROLOGICAL: Negative for dizziness and headaches.  PSYCHIATRIC: Negative for sleep disturbance and to question depression screen.  Negative for depression, negative for anhedonia.      Objective:  PHYSICAL EXAM:   VS: BP 130/78   Pulse 88   Temp 97.6 F (36.4 C)   Resp 16   Ht 5'  3 (1.6 m)   Wt 172 lb (78 kg)   LMP  (LMP Unknown)   SpO2 97%   BMI 30.47 kg/m   GEN: Well nourished, well developed, in no acute distress  Cardiac: RRR; no murmurs, rubs, or gallops,no edema -  Respiratory:  normal respiratory rate and pattern with no distress - normal breath sounds with no rales, rhonchi, wheezes or rubs MS: no deformity or atrophy  Skin: warm and dry, no rash - small resolving furuncle on left breast Neuro:  Alert and Oriented x 3, Strength and sensation are intact - CN II-Xii grossly intact Psych: euthymic mood, appropriate affect and demeanor  Diabetic Foot Exam - Simple   Simple Foot Form Diabetic Foot exam was performed with the following findings: Yes 12/26/2024  9:28 AM  Visual Inspection No deformities, no ulcerations, no other skin breakdown bilaterally: Yes See comments: Yes Sensation Testing Intact to touch and monofilament testing bilaterally: Yes Pulse Check Posterior Tibialis and Dorsalis pulse intact bilaterally: Yes Comments Callus on right big toe.      Lab Results  Component Value Date   WBC 13.7 (H) 11/02/2024   HGB 13.9 11/02/2024   HCT 43.3 11/02/2024   PLT 300 11/02/2024   GLUCOSE 69 11/02/2024   CHOL 155 06/14/2024   TRIG 101 06/14/2024   HDL 56 06/14/2024   LDLCALC 81 06/14/2024   ALT 13 11/02/2024    AST 18 11/02/2024   NA 143 11/02/2024   K 4.8 11/02/2024   CL 104 11/02/2024   CREATININE 1.47 (H) 11/02/2024   BUN 11 11/02/2024   CO2 32 11/02/2024   TSH 1.560 06/14/2024   HGBA1C 6.8 (H) 06/14/2024      Assessment & Plan:   Problem List Items Addressed This Visit       Cardiovascular and Mediastinum   Hypertension associated with diabetes (HCC)   Relevant Orders   CBC with Differential/Platelet   Comprehensive metabolic panel Continue current meds     Endocrine   Hypothyroidism   Relevant Orders   TSH Continue med     Other   Hyperlipidemia associated with diabetes (HCC)   Relevant Orders   Lipid panel Continue meds and watch diet   Vitamin D  insufficiency   Relevant Orders   VITAMIN D  25 Hydroxy (Vit-D Deficiency, Fractures)   Depression with anxiety Continue current med   Type 2 diabetes mellitus with hyperglycemia without use of long term insulin (HCC) Hgb A1c pending Watch diet Labwork pending Urine microalbumin Will send for recent eye exam  Other Visit Diagnoses     Restless leg syndrome       Relevant Medications   Stop requip  Switch to mirapex0.125mg  qhs   Seronegative rheumatoid arthritis Follow up with specialists Continue plaquenil   Need flu vaccine Flublok given  Breast cancer screening Mammogram ordered     .  Meds ordered this encounter  Medications   pramipexole  (MIRAPEX ) 0.125 MG tablet    Sig: Take 1 tablet (0.125 mg total) by mouth at bedtime as needed.    Dispense:  30 tablet    Refill:  0    Supervising Provider:   COX, KIRSTEN [id number]    Orders Placed This Encounter  Procedures   MM 3D SCREENING MAMMOGRAM BILATERAL BREAST   Flu vaccine, recombinant, trivalent, inj   Microalbumin/Creatinine Ratio, Urine   CBC with Differential/Platelet   Comprehensive metabolic panel with GFR   TSH   Lipid panel   Hemoglobin A1c   VITAMIN D  25  Hydroxy (Vit-D Deficiency, Fractures)     Follow-up: Return in about 6  months (around 06/26/2025) for chronic fasting follow-up.  An After Visit Summary was printed and given to the patient.  CAMIE JONELLE NICHOLAUS DEVONNA [facility] [phone] "

## 2024-12-26 NOTE — Patient Instructions (Signed)
 If I have ordered a referral, lab work, or a test, please watch for messages/letters in your [facility]. Please be aware of unknown numbers, as this may be a specialist's office attempting to call and schedule your appointment. You may wish to enter the specialist's phone number in your contacts, so your phone will not block the calls as SPAM. If you have NOT been contacted with in 2 weeks: Please call the specialist's office  Call [facility].

## 2024-12-27 ENCOUNTER — Ambulatory Visit: Payer: Self-pay

## 2024-12-27 ENCOUNTER — Ambulatory Visit: Payer: Self-pay | Admitting: Physician Assistant

## 2024-12-27 LAB — LIPID PANEL
Chol/HDL Ratio: 3.3 ratio (ref 0.0–4.4)
Cholesterol, Total: 147 mg/dL (ref 100–199)
HDL: 45 mg/dL
LDL Chol Calc (NIH): 61 mg/dL (ref 0–99)
Triglycerides: 256 mg/dL — ABNORMAL HIGH (ref 0–149)
VLDL Cholesterol Cal: 41 mg/dL — ABNORMAL HIGH (ref 5–40)

## 2024-12-27 LAB — CBC WITH DIFFERENTIAL/PLATELET
Basophils Absolute: 0.1 x10E3/uL (ref 0.0–0.2)
Basos: 1 %
EOS (ABSOLUTE): 0.2 x10E3/uL (ref 0.0–0.4)
Eos: 2 %
Hematocrit: 39.7 % (ref 34.0–46.6)
Hemoglobin: 12.9 g/dL (ref 11.1–15.9)
Immature Grans (Abs): 0 x10E3/uL (ref 0.0–0.1)
Immature Granulocytes: 0 %
Lymphocytes Absolute: 1.8 x10E3/uL (ref 0.7–3.1)
Lymphs: 17 %
MCH: 30.1 pg (ref 26.6–33.0)
MCHC: 32.5 g/dL (ref 31.5–35.7)
MCV: 93 fL (ref 79–97)
Monocytes Absolute: 1.1 x10E3/uL — ABNORMAL HIGH (ref 0.1–0.9)
Monocytes: 11 %
Neutrophils Absolute: 6.9 x10E3/uL (ref 1.4–7.0)
Neutrophils: 69 %
Platelets: 289 x10E3/uL (ref 150–450)
RBC: 4.28 x10E6/uL (ref 3.77–5.28)
RDW: 13.2 % (ref 11.7–15.4)
WBC: 10.1 x10E3/uL (ref 3.4–10.8)

## 2024-12-27 LAB — COMPREHENSIVE METABOLIC PANEL WITH GFR
ALT: 9 IU/L (ref 0–32)
AST: 18 IU/L (ref 0–40)
Albumin: 4.6 g/dL (ref 3.9–4.9)
Alkaline Phosphatase: 77 IU/L (ref 49–135)
BUN/Creatinine Ratio: 11 — ABNORMAL LOW (ref 12–28)
BUN: 13 mg/dL (ref 8–27)
Bilirubin Total: 0.3 mg/dL (ref 0.0–1.2)
CO2: 25 mmol/L (ref 20–29)
Calcium: 9.9 mg/dL (ref 8.7–10.3)
Chloride: 103 mmol/L (ref 96–106)
Creatinine, Ser: 1.14 mg/dL — ABNORMAL HIGH (ref 0.57–1.00)
Globulin, Total: 2.5 g/dL (ref 1.5–4.5)
Glucose: 111 mg/dL — ABNORMAL HIGH (ref 70–99)
Potassium: 5 mmol/L (ref 3.5–5.2)
Sodium: 142 mmol/L (ref 134–144)
Total Protein: 7.1 g/dL (ref 6.0–8.5)
eGFR: 55 mL/min/1.73 — ABNORMAL LOW

## 2024-12-27 LAB — VITAMIN D 25 HYDROXY (VIT D DEFICIENCY, FRACTURES): Vit D, 25-Hydroxy: 23.1 ng/mL — ABNORMAL LOW (ref 30.0–100.0)

## 2024-12-27 LAB — MICROALBUMIN / CREATININE URINE RATIO
Creatinine, Urine: 67.6 mg/dL
Microalb/Creat Ratio: 5 mg/g{creat} (ref 0–29)
Microalbumin, Urine: 3.1 ug/mL

## 2024-12-27 LAB — TSH: TSH: 11.3 u[IU]/mL — ABNORMAL HIGH (ref 0.450–4.500)

## 2024-12-27 LAB — HEMOGLOBIN A1C
Est. average glucose Bld gHb Est-mCnc: 146 mg/dL
Hgb A1c MFr Bld: 6.7 % — ABNORMAL HIGH (ref 4.8–5.6)

## 2024-12-27 NOTE — Telephone Encounter (Signed)
 FYI Only or Action Required?: Action required by provider: clinical question for provider.  Patient was last seen in primary care on 12/26/2024 by Nicholaus Credit, PA-C.  Called Nurse Triage reporting Fatigue.  Symptoms began several weeks ago.  Interventions attempted: Rest, hydration, or home remedies.  Symptoms are: stable.  Triage Disposition: Home Care  Patient/caregiver understands and will follow disposition?: Yes Reason for Disposition  Mild weakness or fatigue with acute minor illness (e.g., colds)  Answer Assessment - Initial Assessment Questions Patient reports seeing PCP yesterday, started pramipexole  (MIRAPEX ) 0.125 MG tablet for restless legs, was advised it could take a few weeks to see full effects. Denies new or worsening symptoms. Patient reports wanting lab results back from PCP. Please advise.   1. DESCRIPTION: Describe how you are feeling.     Extremely tired, not sleeping, restless legs  2. SEVERITY: How bad is it?  Can you stand and walk?     Yes, feels better when standing up and walk  3. ONSET: When did these symptoms begin? (e.g., hours, days, weeks, months)     On going  4. CAUSE: What do you think is causing the weakness or fatigue? (e.g., not drinking enough fluids, medical problem, trouble sleeping)     Fatigue due to not sleeping well from restless legs and arthritis  5. NEW MEDICINES:  Have you started on any new medicines recently? (e.g., opioid pain medicines, benzodiazepines, muscle relaxants, antidepressants, antihistamines, neuroleptics, beta blockers)     Denies, just Mirapex  for restless legs  6. OTHER SYMPTOMS: Do you have any other symptoms? (e.g., chest pain, fever, cough, SOB, vomiting, diarrhea, bleeding, other areas of pain)     Denies  Protocols used: Weakness (Generalized) and Fatigue-A-AH  Copied from CRM #[id number]. Topic: Clinical - Red Word Triage >> Dec 27, 2024 11:17 AM Joesph NOVAK wrote: Red Word that prompted  transfer to Nurse Triage: Extreme fatigue, no energy, wants to know what she should do>> pt concerned with her lab results(pcp has not read)

## 2024-12-27 NOTE — Telephone Encounter (Signed)
 Labs have been reviewed and sent to pt in another message

## 2025-01-16 ENCOUNTER — Telehealth: Payer: Self-pay | Admitting: *Deleted

## 2025-01-16 NOTE — Telephone Encounter (Signed)
 Patient contacted the office and left message stating she was seen in the ER on Sunday. Patient states she was advised she was having a RA. Patient states she also had a CT scan on her neck which showed a bulging disc in her neck. Patient states she was given a prescription for prednisone . She states she was also advised to take Tylenol and Motrin. Patient states she would like to know if you are in agreement with this. Patient has a follow up scheduled for 02/28/2025.

## 2025-01-16 NOTE — Telephone Encounter (Signed)
 Patient may take Motrin for a short period of time as it may increase the risk of GI bleed, liver and kidney toxicity.  She may also consider physical therapy.

## 2025-01-16 NOTE — Telephone Encounter (Signed)
 Advised patient that per Dr. Dolphus: Patient may take Motrin for a short period of time as it may increase the risk of GI bleed, liver and kidney toxicity. She may also consider physical therapy. Patient verbalized understanding and states she will be seeing ortho tomorrow. She wants to hold off on PT for now.

## 2025-01-17 ENCOUNTER — Other Ambulatory Visit: Payer: Self-pay

## 2025-01-17 ENCOUNTER — Other Ambulatory Visit: Payer: Self-pay | Admitting: Physician Assistant

## 2025-01-17 DIAGNOSIS — G2581 Restless legs syndrome: Secondary | ICD-10-CM

## 2025-01-17 MED ORDER — PRAMIPEXOLE DIHYDROCHLORIDE 0.125 MG PO TABS: 0.1250 mg | ORAL_TABLET | Freq: Every evening | ORAL | 0 refills | Status: DC | PRN

## 2025-01-17 NOTE — Telephone Encounter (Signed)
 Copied from CRM #[id number]. Topic: Clinical - Medication Refill >> Jan 16, 2025  4:24 PM Winona R wrote: Medication: pramipexole  (MIRAPEX ) 0.125 MG tablet- pt states this medication is working well for her please inform PA Nicholaus   Has the patient contacted their pharmacy? No (Agent: If no, request that the patient contact the pharmacy for the refill. If patient does not wish to contact the pharmacy document the reason why and proceed with request.) (Agent: If yes, when and what did the pharmacy advise?)  This is the patient's preferred pharmacy:  [id number] - [place], [place] - [address] [facility] [place] [address] Phone: [phone] Fax: [phone]  Is this the correct pharmacy for this prescription? Yes If no, delete pharmacy and type the correct one.   Has the prescription been filled recently? Yes  Is the patient out of the medication? No- will be out Friday   Has the patient been seen for an appointment in the last year OR does the patient have an upcoming appointment? Yes  Can we respond through MyChart? Yes  Agent: Please be advised that Rx refills may take up to 3 business days. We ask that you follow-up with your pharmacy.

## 2025-01-24 ENCOUNTER — Encounter

## 2025-01-29 ENCOUNTER — Other Ambulatory Visit: Payer: Self-pay | Admitting: Family Medicine

## 2025-01-29 DIAGNOSIS — E039 Hypothyroidism, unspecified: Secondary | ICD-10-CM

## 2025-02-10 ENCOUNTER — Encounter

## 2025-02-28 ENCOUNTER — Ambulatory Visit: Admitting: Physician Assistant

## 2025-06-26 ENCOUNTER — Ambulatory Visit: Admitting: Physician Assistant
